# Patient Record
Sex: Female | Born: 1945 | Race: Black or African American | Hispanic: No | Marital: Single | Smoking: Never smoker
Health system: Southern US, Community
[De-identification: ages and names within clinical notes are randomized; demographics above are authoritative.]

## PROBLEM LIST (undated history)

## (undated) DIAGNOSIS — E119 Type 2 diabetes mellitus without complications: Secondary | ICD-10-CM

## (undated) DIAGNOSIS — I10 Essential (primary) hypertension: Secondary | ICD-10-CM

## (undated) HISTORY — PX: CARPAL TUNNEL RELEASE: SHX101

## (undated) HISTORY — PX: ABDOMINAL HYSTERECTOMY: SHX81

---

## 2015-05-29 ENCOUNTER — Emergency Department (HOSPITAL_COMMUNITY): Payer: Medicare Other

## 2015-05-29 ENCOUNTER — Emergency Department (HOSPITAL_COMMUNITY)
Admission: EM | Admit: 2015-05-29 | Discharge: 2015-05-29 | Disposition: A | Discharge status: Home / Self Care | Payer: Medicare Other | Attending: Emergency Medicine | Admitting: Emergency Medicine

## 2015-05-29 ENCOUNTER — Encounter (HOSPITAL_COMMUNITY): Payer: Self-pay | Admitting: Oncology

## 2015-05-29 DIAGNOSIS — W1839XA Other fall on same level, initial encounter: Secondary | ICD-10-CM | POA: Insufficient documentation

## 2015-05-29 DIAGNOSIS — R739 Hyperglycemia, unspecified: Secondary | ICD-10-CM

## 2015-05-29 DIAGNOSIS — Y92009 Unspecified place in unspecified non-institutional (private) residence as the place of occurrence of the external cause: Secondary | ICD-10-CM

## 2015-05-29 DIAGNOSIS — I10 Essential (primary) hypertension: Secondary | ICD-10-CM | POA: Diagnosis not present

## 2015-05-29 DIAGNOSIS — Y92002 Bathroom of unspecified non-institutional (private) residence single-family (private) house as the place of occurrence of the external cause: Secondary | ICD-10-CM | POA: Insufficient documentation

## 2015-05-29 DIAGNOSIS — Y998 Other external cause status: Secondary | ICD-10-CM | POA: Diagnosis not present

## 2015-05-29 DIAGNOSIS — E1165 Type 2 diabetes mellitus with hyperglycemia: Secondary | ICD-10-CM | POA: Insufficient documentation

## 2015-05-29 DIAGNOSIS — G8929 Other chronic pain: Secondary | ICD-10-CM | POA: Insufficient documentation

## 2015-05-29 DIAGNOSIS — W19XXXA Unspecified fall, initial encounter: Secondary | ICD-10-CM

## 2015-05-29 DIAGNOSIS — Z043 Encounter for examination and observation following other accident: Secondary | ICD-10-CM | POA: Diagnosis present

## 2015-05-29 DIAGNOSIS — Y9389 Activity, other specified: Secondary | ICD-10-CM | POA: Insufficient documentation

## 2015-05-29 HISTORY — DX: Essential (primary) hypertension: I10

## 2015-05-29 HISTORY — DX: Type 2 diabetes mellitus without complications: E11.9

## 2015-05-29 LAB — COMPREHENSIVE METABOLIC PANEL
ALBUMIN: 4.3 g/dL (ref 3.5–5.0)
ALK PHOS: 76 U/L (ref 38–126)
ALT: 15 U/L (ref 14–54)
AST: 12 U/L — AB (ref 15–41)
Anion gap: 10 (ref 5–15)
BILIRUBIN TOTAL: 0.3 mg/dL (ref 0.3–1.2)
BUN: 19 mg/dL (ref 6–20)
CHLORIDE: 100 mmol/L — AB (ref 101–111)
CO2: 27 mmol/L (ref 22–32)
Calcium: 9.5 mg/dL (ref 8.9–10.3)
Creatinine, Ser: 0.89 mg/dL (ref 0.44–1.00)
GFR calc Af Amer: >60 mL/min (ref >60)
Glucose, Bld: 427 mg/dL — ABNORMAL HIGH (ref 65–99)
Potassium: 4 mmol/L (ref 3.5–5.1)
Sodium: 137 mmol/L (ref 135–145)
Total Protein: 7.5 g/dL (ref 6.5–8.1)

## 2015-05-29 LAB — URINALYSIS, ROUTINE W REFLEX MICROSCOPIC
BILIRUBIN URINE: NEGATIVE
Glucose, UA: >1000 mg/dL — AB
HGB URINE DIPSTICK: NEGATIVE
Ketones, ur: NEGATIVE mg/dL
Leukocytes, UA: NEGATIVE
NITRITE: NEGATIVE
PROTEIN: NEGATIVE mg/dL
SPECIFIC GRAVITY, URINE: 1.043 — AB (ref 1.005–1.030)
pH: 5 (ref 5.0–8.0)

## 2015-05-29 LAB — CBC WITH DIFFERENTIAL/PLATELET
Basophils Absolute: 0 10*3/uL (ref 0.0–0.1)
Basophils Relative: 0 %
Eosinophils Absolute: 0.2 10*3/uL (ref 0.0–0.7)
Eosinophils Relative: 2 %
HEMATOCRIT: 45 % (ref 36.0–46.0)
HEMOGLOBIN: 14.6 g/dL (ref 12.0–15.0)
LYMPHS ABS: 2.7 10*3/uL (ref 0.7–4.0)
LYMPHS PCT: 36 %
MCH: 26.4 pg (ref 26.0–34.0)
MCHC: 32.4 g/dL (ref 30.0–36.0)
MCV: 81.5 fL (ref 78.0–100.0)
MONOS PCT: 6 %
Monocytes Absolute: 0.4 10*3/uL (ref 0.1–1.0)
NEUTROS ABS: 4.2 10*3/uL (ref 1.7–7.7)
NEUTROS PCT: 56 %
PLATELETS: 171 10*3/uL (ref 150–400)
RBC: 5.52 MIL/uL — ABNORMAL HIGH (ref 3.87–5.11)
RDW: 13.8 % (ref 11.5–15.5)
WBC: 7.5 10*3/uL (ref 4.0–10.5)

## 2015-05-29 LAB — CBG MONITORING, ED
GLUCOSE-CAPILLARY: 370 mg/dL — AB (ref 65–99)
Glucose-Capillary: 534 mg/dL — ABNORMAL HIGH (ref 65–99)

## 2015-05-29 LAB — URINE MICROSCOPIC-ADD ON
RBC / HPF: NONE SEEN RBC/hpf (ref 0–5)
WBC UA: NONE SEEN WBC/hpf (ref 0–5)

## 2015-05-29 MED ORDER — METFORMIN HCL 500 MG PO TABS: 500.0000 mg | ORAL_TABLET | Freq: Two times a day (BID) | ORAL | Status: AC

## 2015-05-29 MED ORDER — SODIUM CHLORIDE 0.9 % IV SOLN
1000.0000 mL | Freq: Once | INTRAVENOUS | Status: AC
  Administered 2015-05-29: 1000 mL via INTRAVENOUS

## 2015-05-29 MED ORDER — INSULIN ASPART 100 UNIT/ML ~~LOC~~ SOLN
8.0000 [IU] | Freq: Once | SUBCUTANEOUS | Status: AC
  Administered 2015-05-29: 8 [IU] via INTRAVENOUS
  Filled 2015-05-29: qty 1

## 2015-05-29 MED ORDER — SODIUM CHLORIDE 0.9 % IV SOLN
1000.0000 mL | INTRAVENOUS | Status: DC
  Administered 2015-05-29: 1000 mL via INTRAVENOUS

## 2015-05-29 NOTE — Discharge Instructions (Signed)
Fall Prevention in the Home  Falls can cause injuries and can affect people from all age groups. There are many simple things that you can do to make your home safe and to help prevent falls. WHAT CAN I DO ON THE OUTSIDE OF MY HOME?  Regularly repair the edges of walkways and driveways and fix any cracks.  Remove high doorway thresholds.  Trim any shrubbery on the main path into your home.  Use bright outdoor lighting.  Clear walkways of debris and clutter, including tools and rocks.  Regularly check that handrails are securely fastened and in good repair. Both sides of any steps should have handrails.  Install guardrails along the edges of any raised decks or porches.  Have leaves, snow, and ice cleared regularly.  Use sand or salt on walkways during winter months.  In the garage, clean up any spills right away, including grease or oil spills. WHAT CAN I DO IN THE BATHROOM?  Use night lights.  Install grab bars by the toilet and in the tub and shower. Do not use towel bars as grab bars.  Use non-skid mats or decals on the floor of the tub or shower.  If you need to sit down while you are in the shower, use a plastic, non-slip stool.[place]  Keep the floor dry. Immediately clean up any water that spills on the floor.  Remove soap buildup in the tub or shower on a regular basis.  Attach bath mats securely with double-sided non-slip rug tape.  Remove throw rugs and other tripping hazards from the floor. WHAT CAN I DO IN THE BEDROOM?  Use night lights.  Make sure that a bedside light is easy to reach.  Do not use oversized bedding that drapes onto the floor.  Have a firm chair that has side arms to use for getting dressed.  Remove throw rugs and other tripping hazards from the floor. WHAT CAN I DO IN THE KITCHEN?   Clean up any spills right away.  Avoid walking on wet floors.  Place frequently used items in easy-to-reach places.  If you need to reach for something  above you, use a sturdy step stool that has a grab bar.  Keep electrical cables out of the way.  Do not use floor polish or wax that makes floors slippery. If you have to use wax, make sure that it is non-skid floor wax.  Remove throw rugs and other tripping hazards from the floor. WHAT CAN I DO IN THE STAIRWAYS?  Do not leave any items on the stairs.  Make sure that there are handrails on both sides of the stairs. Fix handrails that are broken or loose. Make sure that handrails are as long as the stairways.  Check any carpeting to make sure that it is firmly attached to the stairs. Fix any carpet that is loose or worn.  Avoid having throw rugs at the top or bottom of stairways, or secure the rugs with carpet tape to prevent them from moving.  Make sure that you have a light switch at the top of the stairs and the bottom of the stairs. If you do not have them, have them installed. WHAT ARE SOME OTHER FALL PREVENTION TIPS?  Wear closed-toe shoes that fit well and support your feet. Wear shoes that have rubber soles or low heels.  When you use a stepladder, make sure that it is completely opened and that the sides are firmly locked. Have someone hold the ladder while you  are using it. Do not climb a closed stepladder.  Add color or contrast paint or tape to grab bars and handrails in your home. Place contrasting color strips on the first and last steps.  Use mobility aids as needed, such as canes, walkers, scooters, and crutches.  Turn on lights if it is dark. Replace any light bulbs that burn out.  Set up furniture so that there are clear paths. Keep the furniture in the same spot.  Fix any uneven floor surfaces.  Choose a carpet design that does not hide the edge of steps of a stairway.  Be aware of any and all pets.  Review your medicines with your healthcare provider. Some medicines can cause dizziness or changes in blood pressure, which increase your risk of falling. Talk  with your health care provider about other ways that you can decrease your risk of falls. This may include working with a physical therapist or trainer to improve your strength, balance, and endurance.   This information is not intended to replace advice given to you by your health care provider. Make sure you discuss any questions you have with your health care provider.   Document Released: 03/29/2002 Document Revised: 08/23/2014 Document Reviewed: 05/13/2014 Elsevier Interactive Patient Education 2016 [employer].  Hyperglycemia Hyperglycemia occurs when the glucose (sugar) in your blood is too high. Hyperglycemia can happen for many reasons, but it most often happens to people who do not know they have diabetes or are not managing their diabetes properly.  CAUSES  Whether you have diabetes or not, there are other causes of hyperglycemia. Hyperglycemia can occur when you have diabetes, but it can also occur in other situations that you might not be as aware of, such as: Diabetes  If you have diabetes and are having problems controlling your blood glucose, hyperglycemia could occur because of some of the following reasons:  Not following your meal plan.  Not taking your diabetes medications or not taking it properly.  Exercising less or doing less activity than you normally do.  Being sick. Pre-diabetes  This cannot be ignored. Before people develop Type 2 diabetes, they almost always have "pre-diabetes." This is when your blood glucose levels are higher than normal, but not yet high enough to be diagnosed as diabetes. Research has shown that some long-term damage to the body, especially the heart and circulatory system, may already be occurring during pre-diabetes. If you take action to manage your blood glucose when you have pre-diabetes, you may delay or prevent Type 2 diabetes from developing. Stress  If you have diabetes, you may be "diet" controlled or on oral medications or  insulin to control your diabetes. However, you may find that your blood glucose is higher than usual in the hospital whether you have diabetes or not. This is often referred to as "stress hyperglycemia." Stress can elevate your blood glucose. This happens because of hormones put out by the body during times of stress. If stress has been the cause of your high blood glucose, it can be followed regularly by your caregiver. That way he/she can make sure your hyperglycemia does not continue to get worse or progress to diabetes. Steroids  Steroids are medications that act on the infection fighting system (immune system) to block inflammation or infection. One side effect can be a rise in blood glucose. Most people can produce enough extra insulin to allow for this rise, but for those who cannot, steroids make blood glucose levels go even higher. It  is not unusual for steroid treatments to "uncover" diabetes that is developing. It is not always possible to determine if the hyperglycemia will go away after the steroids are stopped. A special blood test called an A1c is sometimes done to determine if your blood glucose was elevated before the steroids were started. SYMPTOMS  Thirsty.  Frequent urination.  Dry mouth.  Blurred vision.  Tired or fatigue.  Weakness.  Sleepy.  Tingling in feet or leg. DIAGNOSIS  Diagnosis is made by monitoring blood glucose in one or all of the following ways:  A1c test. This is a chemical found in your blood.  Fingerstick blood glucose monitoring.  Laboratory results. TREATMENT  First, knowing the cause of the hyperglycemia is important before the hyperglycemia can be treated. Treatment may include, but is not be limited to:  Education.  Change or adjustment in medications.  Change or adjustment in meal plan.  Treatment for an illness, infection, etc.  More frequent blood glucose monitoring.  Change in exercise plan.  Decreasing or stopping  steroids.  Lifestyle changes. HOME CARE INSTRUCTIONS   Test your blood glucose as directed.  Exercise regularly. Your caregiver will give you instructions about exercise. Pre-diabetes or diabetes which comes on with stress is helped by exercising.  Eat wholesome, balanced meals. Eat often and at regular, fixed times. Your caregiver or nutritionist will give you a meal plan to guide your sugar intake.  Being at an ideal weight is important. If needed, losing as little as 10 to 15 pounds may help improve blood glucose levels. SEEK MEDICAL CARE IF:   You have questions about medicine, activity, or diet.  You continue to have symptoms (problems such as increased thirst, urination, or weight gain). SEEK IMMEDIATE MEDICAL CARE IF:   You are vomiting or have diarrhea.  Your breath smells fruity.  You are breathing faster or slower.  You are very sleepy or incoherent.  You have numbness, tingling, or pain in your feet or hands.  You have chest pain.  Your symptoms get worse even though you have been following your caregiver's orders.  If you have any other questions or concerns.   This information is not intended to replace advice given to you by your health care provider. Make sure you discuss any questions you have with your health care provider.   Document Released: 10/02/2000 Document Revised: 07/01/2011 Document Reviewed: 12/13/2014 Elsevier Interactive Patient Education 2016 [employer].  Metformin tablets What is this medicine? METFORMIN (met FOR min) is used to treat type 2 diabetes. It helps to control blood sugar. Treatment is combined with diet and exercise. This medicine can be used alone or with other medicines for diabetes. This medicine may be used for other purposes; ask your health care provider or pharmacist if you have questions. What should I tell my health care provider before I take this medicine? They need to know if you have any of these  conditions: -anemia -frequently drink alcohol-containing beverages -become easily dehydrated -heart attack -heart failure that is treated with medications -kidney disease -liver disease -polycystic ovary syndrome -serious infection or injury -vomiting -an unusual or allergic reaction to metformin, other medicines, foods, dyes, or preservatives -pregnant or trying to get pregnant -breast-feeding How should I use this medicine? Take this medicine by mouth. Take it with meals. Swallow the tablets with a drink of water. Follow the directions on the prescription label. Take your medicine at regular intervals. Do not take your medicine more often than directed. Talk  to your pediatrician regarding the use of this medicine in children. While this drug may be prescribed for children as young as 87 years of age for selected conditions, precautions do apply. Overdosage: If you think you have taken too much of this medicine contact a poison control center or emergency room at once. NOTE: This medicine is only for you. Do not share this medicine with others. What if I miss a dose? If you miss a dose, take it as soon as you can. If it is almost time for your next dose, take only that dose. Do not take double or extra doses. What may interact with this medicine? Do not take this medicine with any of the following medications: -dofetilide -gatifloxacin -certain contrast medicines given before X-rays, CT scans, MRI, or other procedures This medicine may also interact with the following medications: -acetazolamide -certain medicines for HIV infection or hepatitis, like adefovir, emtricitabine, entecavir, lamivudine, or tenofovir -cimetidine -crizotinib -digoxin -diuretics -female hormones, like estrogens or progestins and birth control pills -glycopyrrolate -isoniazid -lamotrigine -medicines for blood pressure, heart disease, irregular heart  beat -memantine -midodrine -methazolamide -morphine -nicotinic acid -phenothiazines like chlorpromazine, mesoridazine, prochlorperazine, thioridazine -phenytoin -procainamide -propantheline -quinidine -quinine -ranitidine -ranolazine -steroid medicines like prednisone or cortisone -stimulant medicines for attention disorders, weight loss, or to stay awake -thyroid medicines -topiramate -trimethoprim -trospium -vancomycin -vandetanib -zonisamide This list may not describe all possible interactions. Give your health care provider a list of all the medicines, herbs, non-prescription drugs, or dietary supplements you use. Also tell them if you smoke, drink alcohol, or use illegal drugs. Some items may interact with your medicine. What should I watch for while using this medicine? Visit your doctor or health care professional for regular checks on your progress. A test called the HbA1C (A1C) will be monitored. This is a simple blood test. It measures your blood sugar control over the last 2 to 3 months. You will receive this test every 3 to 6 months. Learn how to check your blood sugar. Learn the symptoms of low and high blood sugar and how to manage them. Always carry a quick-source of sugar with you in case you have symptoms of low blood sugar. Examples include hard sugar candy or glucose tablets. Make sure others know that you can choke if you eat or drink when you develop serious symptoms of low blood sugar, such as seizures or unconsciousness. They must get medical help at once. Tell your doctor or health care professional if you have high blood sugar. You might need to change the dose of your medicine. If you are sick or exercising more than usual, you might need to change the dose of your medicine. Do not skip meals. Ask your doctor or health care professional if you should avoid alcohol. Many nonprescription cough and cold products contain sugar or alcohol. These can affect blood  sugar. This medicine may cause ovulation in premenopausal women who do not have regular monthly periods. This may increase your chances of becoming pregnant. You should not take this medicine if you become pregnant or think you may be pregnant. Talk with your doctor or health care professional about your birth control options while taking this medicine. Contact your doctor or health care professional right away if think you are pregnant. If you are going to need surgery, a MRI, CT scan, or other procedure, tell your doctor that you are taking this medicine. You may need to stop taking this medicine before the procedure. Wear a medical ID  bracelet or chain, and carry a card that describes your disease and details of your medicine and dosage times. What side effects may I notice from receiving this medicine? Side effects that you should report to your doctor or health care professional as soon as possible: -allergic reactions like skin rash, itching or hives, swelling of the face, lips, or tongue -breathing problems -feeling faint or lightheaded, falls -muscle aches or pains -signs and symptoms of low blood sugar such as feeling anxious, confusion, dizziness, increased hunger, unusually weak or tired, sweating, shakiness, cold, irritable, headache, blurred vision, fast heartbeat, loss of consciousness -slow or irregular heartbeat -unusual stomach pain or discomfort -unusually tired or weak Side effects that usually do not require medical attention (report to your doctor or health care professional if they continue or are bothersome): -diarrhea -headache -heartburn -metallic taste in mouth -nausea -stomach gas, upset This list may not describe all possible side effects. Call your doctor for medical advice about side effects. You may report side effects to FDA at 1-800-FDA-1088. Where should I keep my medicine? Keep out of the reach of children. Store at room temperature between 15 and 30 degrees  C (59 and 86 degrees F). Protect from moisture and light. Throw away any unused medicine after the expiration date. NOTE: This sheet is a summary. It may not cover all possible information. If you have questions about this medicine, talk to your doctor, pharmacist, or health care provider.    2016, Elsevier/Gold Standard. (2013-09-21 22:14:40)   Emergency Department Resource Guide 1) Find a Doctor and Pay Out of Pocket Although you won't have to find out who is covered by your insurance plan, it is a good idea to ask around and get recommendations. You will then need to call the office and see if the doctor you have chosen will accept you as a new patient and what types of options they offer for patients who are self-pay. Some doctors offer discounts or will set up payment plans for their patients who do not have insurance, but you will need to ask so you aren't surprised when you get to your appointment.  2) Contact Your Local Health Department Not all health departments have doctors that can see patients for sick visits, but many do, so it is worth a call to see if yours does. If you don't know where your local health department is, you can check in your phone book. The CDC also has a tool to help you locate your state's health department, and many state websites also have listings of all of their local health departments.  3) Find a [facility] If your illness is not likely to be very severe or complicated, you may want to try a walk in clinic. These are popping up all over the country in pharmacies, drugstores, and shopping centers. They're usually staffed by nurse practitioners or physician assistants that have been trained to treat common illnesses and complaints. They're usually fairly quick and inexpensive. However, if you have serious medical issues or chronic medical problems, these are probably not your best option.  No Primary Care Doctor: - Call Health Connect at  [phone] - they  can help you locate a primary care doctor that  accepts your insurance, provides certain services, etc. - Physician Referral Service- [phone]  Chronic Pain Problems: Organization         Address  Phone   Notes  [facility]  [phone] Patients need to be referred by  their primary care doctor.   Medication Assistance: Organization         Address  Phone   Notes  [facility] Medication [facility] [address] [place], [place] [address] [phone] --Must be a resident of [facility] -- Must have NO insurance coverage whatsoever (no Medicaid/ Medicare, etc.) -- The pt. MUST have a primary care doctor that directs their care regularly and follows them in the community   [facility]  [phone]   [employer]  [phone]    Agencies that provide inexpensive medical care: Organization         Address  Phone   Notes  [place]  [phone]   [facility]    [phone]   [facility] [place], [place] [address] [phone]   [place] [address] [place] [phone]   [facility]anned Parenthood    [phone]   [facility]    [phone]   [place] and [address], [place] Phone:  [phone], Fax:  [phone] Hours of Operation:  9 am - 6 pm, M-F.  Also accepts Medicaid/Medicare and self-pay.  [facility] for [address], [address], [place] Phone: [phone], Fax: [phone]. Hours of Operation:  8:30 am - 5:30 pm, M-F.  Also accepts Medicaid and self-pay.  [facility] [address], [place] Phone: [phone]   [place], [place], [place] [phone], Ext. [phone] Mondays & Thursdays: 7-9 AM.  First 15 patients are seen on a first come, first serve basis.    [place] Providers:  Organization         Address  Phone   Notes  [facility] [address], [place] [phone] Also accepts self-pay patients.  [facility] [address], [place]  [phone]   [place], [address], [place] [phone]   [facility] [address] [place] [phone]   Lucianne Lei [address], [place]   [phone] Only accepts [place] patients after they have their name applied to their card.   Self-Pay (no insurance) in [facility]:  Organization         Address  Phone   Notes  Sickle Cell Patients, [facility] [place] [phone]   [facility] [place] [phone]   [facility] [place]  [place], [place], [place] [phone]   [facility]/Dr. Osei-Bonsu  [address], [place] or [address], [place] [phone] Phone number for both [place] and [place] locations is the same.  [facility] and [facility] [address], [place] [phone]   [facility] [address], [place] or [address] [phone]   [facility] [address] [place] [phone], phone; [phone], fax Sees patients 1st and 3rd Saturday of every month.  Must not qualify for public or private insurance (i.e. Medicaid, Medicare, [place] Health Choice, Veterans' Benefits)  Household income should be no more than 200%  of the poverty level The clinic cannot treat you if you are pregnant or think you are pregnant  Sexually transmitted diseases are not treated at the clinic.    Dental Care: Organization         Address  Phone  Notes  [facility] Department of [facility] [phone] Accepts children up to age 39 who are enrolled in [place] or ; pregnant women with a Medicaid card; and children who have applied for Medicaid or [place] Health Choice, but were declined, whose parents can pay a reduced fee at time of service.  [facility] Department of [facility]  [address], [place] [phone] Accepts children up to age 60 who are enrolled in [place] or [place]; pregnant women with a Medicaid card; and children who have applied for Medicaid or [place] Health Choice, but were declined, whose parents can pay a reduced fee at time of service.  [facility]  [place] [phone] Patients are seen by appointment only. Walk-ins are not accepted. [place] will see patients 1 years of age and older. Monday - Tuesday (8am-5pm) Most Wednesdays (8:30-5pm) $30 per visit, cash only  [facility] Adult [facility] Access PRO[facility]  [address], [facility] [phone] Patients are seen by appointment only. Walk-ins are not accepted. [place] will see patients 79 years of age and older. One Wednesday Evening (Monthly: Volunteer Based).  $30 per visit, cash only  [place]  [phone] for adults; Children under age 36, call Graduate Pediatric Dentistry at [phone]. Children aged 84-14, please call [phone] to request a pediatric application.  Dental services are provided in all areas of dental care including fillings, crowns and bridges, complete and partial dentures, implants, gum treatment, root canals, and extractions. Preventive care is also provided. Treatment is provided to both adults and children. Patients are selected via a lottery and there is often a waiting list.   [facility] [address], [place]  [phone] [email or web address]   [facility]  [facility] [address] [place], [place] [phone], Ext. [phone] Second and Fourth Thursday of each month, opens at 6:30 AM; Clinic ends at 9 AM.  Patients are seen on a first-come first-served basis, and a limited number are seen during each clinic.   [facility]  [address] [place], [place] [phone]   Eligibility Requirements You must have lived in [place], [place], or [place] counties for at least the last three months.   You cannot be eligible for state or federal sponsored [employer], including [employer], [place], or [employer].   You generally cannot be eligible for healthcare insurance through your employer.    How to apply: Eligibility screenings are held every Tuesday and Wednesday afternoon from 1:00 pm until 4:00 pm. You do not need an appointment for the interview!  [facility] [address] [place], [place]   [place]  [place] Department  [place]  [phone]    Behavioral Health Resources in the Community: Intensive Outpatient Programs Organization         Address  Phone  Notes  [place]. [address], [place], [place]   [facility]  [address] [place], [place]   [facility] [address] [place], [place]   [place] [address],  [place], [place] or [phone]   Substance Abuse Resources Organization         Address  Phone  Notes  [facility]  [phone]   [place]  [phone]   [facility]   Chinita Pester  [phone]   Residential & Outpatient Substance Abuse Program  [phone]   Psychological Services Organization         Address  Phone  Notes  [facility] [place]   [place]  [phone]   [place] [address] [place] or [phone]    Mobile Crisis Teams Organization         Address  Phone  Notes  [facility], Mobile Crisis Care Unit  [phone]   [facility]  [address] [place], [place]   [place] [address], [place] [phone]    Self-Help/Support Groups Organization         Address  Phone             Notes  [place] - variety of support groups  [place] Call for more information  Narcotics Anonymous (NA), [facility] [address], [employer] [place]  2 meetings at this location   Special educational needs teacher         Address  Phone  Notes  [facility] [place],    [place]  [phone]   [facility]  [address] [place] [address], [place], [place]   [place], [place] [phone] Admissions: 8am-3pm M-F  Incentives Substance [place] [address],    [place], [place] [phone]   [facility] [address] [place], [place], [place]   [facility] [address],  [place], [place]   [facility] - Intensive Outpatient [address], [place], [place]   [facility]  [place], [place] [phone] or [phone]   [facility] ([facility]) [address] [place], [place] Accepts Medicaid  [facility] [place] [address],  [place] [phone] Substance Abuse/Addiction Treatment   [facility] Organization         Address  Phone  Notes  [facility]  [phone]   Domenic Schwab, PhD [address] [place]lis [place], [place]   [phone] or  [phone]   [place], [place] [phone]   [facility] [address] [place], [place] [phone] Insurance/Medicaid/sponsorship through [facility] [address] [place]                                    [place], [place] [phone] [place] [address], [place] [phone]    Dr. Adele Schilder  [phone]   [facility] Dept. 1) [address], [place] 2) [place] 3)  [address]  [place], [place] [phone]  [phone]   [facility] Child Abuse Hotline [phone] or [phone] (After Hours)

## 2015-05-29 NOTE — ED Notes (Signed)
Patient transported to CT 

## 2015-05-29 NOTE — ED Notes (Signed)
Per EMS pt had a fall tonight  No injury to note  Pt has chronic leg pain  CBG was 465  Pt is a diabetic and takes insulin three times a day but has only taken it once today

## 2015-05-29 NOTE — ED Provider Notes (Signed)
CSN: [id number]     Arrival date & time 05/29/15  0129 History  By signing my name below, I, Phillis Haggis, attest that this documentation has been prepared under the direction and in the presence of Dione Booze, MD. Electronically Signed: Phillis Haggis, ED Scribe. 05/29/2015. 2:55 AM.   Chief Complaint  Patient presents with  . Fall   The history is provided by the patient. No language interpreter was used.   HPI Comments: Rhonda Taylor is a 69 y.o. Female with a hx of chronic leg pain, HTN and DM who presents to the Emergency Department complaining of a fall onset PTA. Pt states that her CBG was elevated at home, and she fell in the bathroom. She states that the last time she checked her sugars was Tuesday, and it "was more than likely" elevated. Pt denies nausea, vomiting, hitting head, or syncope. Per triage note, pt takes insulin 3x a day, but only took one dose today.  Past Medical History  Diagnosis Date  . Hypertension   . Diabetes mellitus without complication [facility])    Past Surgical History  Procedure Laterality Date  . Abdominal hysterectomy    . Carpal tunnel release Right    No family history on file. Social History  Substance Use Topics  . Smoking status: Never Smoker   . Smokeless tobacco: Never Used  . Alcohol Use: No   OB History    No data available     Review of Systems  Gastrointestinal: Negative for nausea and vomiting.  All other systems reviewed and are negative.  Allergies  Review of patient's allergies indicates no known allergies.  Home Medications   Prior to Admission medications   Not on File   BP 150/62 mmHg  Pulse 80  Temp(Src) 98.5 F (36.9 C) (Oral)  Resp 20  Ht  (1.6 m)  Wt 240 lb (108.863 kg)  BMI 42.52 kg/m2  SpO2 96% Physical Exam  Constitutional: She is oriented to person, place, and time. She appears well-developed and well-nourished.  HENT:  Head: Normocephalic and atraumatic.  Eyes: EOM are normal. Pupils are  equal, round, and reactive to light.  Neck: Normal range of motion. Neck supple. No JVD present.  Cardiovascular: Normal rate, regular rhythm and normal heart sounds.  Exam reveals no gallop and no friction rub.   No murmur heard. Pulmonary/Chest: Effort normal and breath sounds normal. She has no wheezes. She has no rales. She exhibits no tenderness.  Abdominal: Soft. Bowel sounds are normal. She exhibits no distension and no mass. There is no tenderness.  Musculoskeletal: Normal range of motion. She exhibits edema.  1+ pitting edema bilaterally  Lymphadenopathy:    She has no cervical adenopathy.  Neurological: She is alert and oriented to person, place, and time. No cranial nerve deficit. She exhibits normal muscle tone. Coordination normal.  Skin: Skin is warm and dry. No rash noted.  Psychiatric: She has a normal mood and affect. Her behavior is normal. Judgment and thought content normal.  Nursing note and vitals reviewed.   ED Course  Procedures (including critical care time) DIAGNOSTIC STUDIES: Oxygen Saturation is 96% on RA, normal by my interpretation.    COORDINATION OF CARE: 1:53 AM-Discussed treatment plan which includes labs with pt at bedside and pt agreed to plan.    Labs Review Labs Reviewed  COMPREHENSIVE METABOLIC PANEL - Abnormal; Notable for the following:    Chloride 100 (*)    Glucose, Bld 427 (*)    AST  12 (*)    All other components within normal limits  CBC WITH DIFFERENTIAL/PLATELET - Abnormal; Notable for the following:    RBC 5.52 (*)    All other components within normal limits  URINALYSIS, ROUTINE W REFLEX MICROSCOPIC (NOT AT [facility]) - Abnormal; Notable for the following:    APPearance CLOUDY (*)    Specific Gravity, Urine 1.043 (*)    Glucose, UA >1000 (*)    All other components within normal limits  URINE MICROSCOPIC-ADD ON - Abnormal; Notable for the following:    Squamous Epithelial / LPF 0-5 (*)    Bacteria, UA RARE (*)    All other  components within normal limits  CBG MONITORING, ED - Abnormal; Notable for the following:    Glucose-Capillary 534 (*)    All other components within normal limits  CBG MONITORING, ED - Abnormal; Notable for the following:    Glucose-Capillary 370 (*)    All other components within normal limits    Imaging Review Ct Head Wo Contrast  05/29/2015  CLINICAL DATA:  Status post fall in bathroom, with concern for head or cervical spine injury. Hyperglycemia. Initial encounter. EXAM: CT HEAD WITHOUT CONTRAST CT CERVICAL SPINE WITHOUT CONTRAST TECHNIQUE: Multidetector CT imaging of the head and cervical spine was performed following the standard protocol without intravenous contrast. Multiplanar CT image reconstructions of the cervical spine were also generated. COMPARISON:  None. FINDINGS: CT HEAD FINDINGS There is no evidence of acute infarction, mass lesion, or intra- or extra-axial hemorrhage on CT. Prominence of the ventricles and sulci reflects mild cortical volume loss. Scattered periventricular and subcortical white matter change likely reflects small vessel ischemic microangiopathy. Mild cerebellar atrophy is noted. The brainstem and fourth ventricle are within normal limits. The basal ganglia are unremarkable in appearance. The cerebral hemispheres demonstrate grossly normal gray-white differentiation. No mass effect or midline shift is seen. There is no evidence of fracture; visualized osseous structures are unremarkable in appearance. The visualized portions of the orbits are within normal limits. The paranasal sinuses and mastoid air cells are well-aerated. No significant soft tissue abnormalities are seen. CT CERVICAL SPINE FINDINGS There is no evidence of fracture or subluxation. Vertebral bodies demonstrate normal height and alignment. Mild disc space narrowing and endplate degenerative change is noted at C6-C7. Prevertebral soft tissues are within normal limits. The visualized neural foramina are  grossly unremarkable. The thyroid gland is unremarkable in appearance. The visualized lung apices are clear. No significant soft tissue abnormalities are seen. IMPRESSION: 1. No evidence of traumatic intracranial injury or fracture. 2. No evidence of fracture or subluxation along the cervical spine. 3. Mild cortical volume loss and scattered small vessel ischemic microangiopathy. Electronically Signed   By: Roanna Raider M.D.   On: 05/29/2015 02:52   Ct Cervical Spine Wo Contrast  05/29/2015  CLINICAL DATA:  Status post fall in bathroom, with concern for head or cervical spine injury. Hyperglycemia. Initial encounter. EXAM: CT HEAD WITHOUT CONTRAST CT CERVICAL SPINE WITHOUT CONTRAST TECHNIQUE: Multidetector CT imaging of the head and cervical spine was performed following the standard protocol without intravenous contrast. Multiplanar CT image reconstructions of the cervical spine were also generated. COMPARISON:  None. FINDINGS: CT HEAD FINDINGS There is no evidence of acute infarction, mass lesion, or intra- or extra-axial hemorrhage on CT. Prominence of the ventricles and sulci reflects mild cortical volume loss. Scattered periventricular and subcortical white matter change likely reflects small vessel ischemic microangiopathy. Mild cerebellar atrophy is noted. The brainstem and fourth ventricle are  within normal limits. The basal ganglia are unremarkable in appearance. The cerebral hemispheres demonstrate grossly normal gray-white differentiation. No mass effect or midline shift is seen. There is no evidence of fracture; visualized osseous structures are unremarkable in appearance. The visualized portions of the orbits are within normal limits. The paranasal sinuses and mastoid air cells are well-aerated. No significant soft tissue abnormalities are seen. CT CERVICAL SPINE FINDINGS There is no evidence of fracture or subluxation. Vertebral bodies demonstrate normal height and alignment. Mild disc space  narrowing and endplate degenerative change is noted at C6-C7. Prevertebral soft tissues are within normal limits. The visualized neural foramina are grossly unremarkable. The thyroid gland is unremarkable in appearance. The visualized lung apices are clear. No significant soft tissue abnormalities are seen. IMPRESSION: 1. No evidence of traumatic intracranial injury or fracture. 2. No evidence of fracture or subluxation along the cervical spine. 3. Mild cortical volume loss and scattered small vessel ischemic microangiopathy. Electronically Signed   By: Roanna Raider M.D.   On: 05/29/2015 02:52   I have personally reviewed and evaluated these images and lab results as part of my medical decision-making.   MDM   Final diagnoses:  None    Fall without evidence of significant injury. She is sent for CT of head and cervical spine which showed no acute injury. Screening labs are obtained showing significant hyperglycemia without evidence of ketoacidosis. After IV fluids, she was still somewhat hyperglycemic and was given dose of Novolog Insulin. I have added metformin to her regimen to try to keep her sugar under better control and she's given th ED resource guide to try to establish primary care.  I personally performed the services described in this documentation, which was scribed in my presence. The recorded information has been reviewed and is accurate.      Dione Booze, MD 05/29/15 [phone]

## 2015-05-29 NOTE — ED Notes (Signed)
Pt reports no pain 

## 2016-01-04 DIAGNOSIS — I10 Essential (primary) hypertension: Secondary | ICD-10-CM | POA: Diagnosis not present

## 2016-01-04 DIAGNOSIS — E1165 Type 2 diabetes mellitus with hyperglycemia: Secondary | ICD-10-CM | POA: Diagnosis not present

## 2016-01-04 DIAGNOSIS — Z794 Long term (current) use of insulin: Secondary | ICD-10-CM | POA: Diagnosis not present

## 2016-01-22 ENCOUNTER — Other Ambulatory Visit (HOSPITAL_BASED_OUTPATIENT_CLINIC_OR_DEPARTMENT_OTHER): Payer: Self-pay

## 2016-01-22 DIAGNOSIS — R5383 Other fatigue: Secondary | ICD-10-CM

## 2016-01-26 ENCOUNTER — Ambulatory Visit (HOSPITAL_BASED_OUTPATIENT_CLINIC_OR_DEPARTMENT_OTHER): Payer: Medicare Other

## 2016-01-31 DIAGNOSIS — E785 Hyperlipidemia, unspecified: Secondary | ICD-10-CM | POA: Diagnosis not present

## 2016-01-31 DIAGNOSIS — I10 Essential (primary) hypertension: Secondary | ICD-10-CM | POA: Diagnosis not present

## 2016-01-31 DIAGNOSIS — Z794 Long term (current) use of insulin: Secondary | ICD-10-CM | POA: Diagnosis not present

## 2016-01-31 DIAGNOSIS — E1165 Type 2 diabetes mellitus with hyperglycemia: Secondary | ICD-10-CM | POA: Diagnosis not present

## 2016-02-04 ENCOUNTER — Ambulatory Visit (INDEPENDENT_AMBULATORY_CARE_PROVIDER_SITE_OTHER): Payer: Medicare Other

## 2016-02-04 ENCOUNTER — Encounter (HOSPITAL_COMMUNITY): Payer: Self-pay | Admitting: *Deleted

## 2016-02-04 ENCOUNTER — Ambulatory Visit (HOSPITAL_COMMUNITY)
Admission: EM | Admit: 2016-02-04 | Discharge: 2016-02-04 | Disposition: A | Discharge status: Home / Self Care | Payer: Medicare Other | Attending: Internal Medicine | Admitting: Internal Medicine

## 2016-02-04 DIAGNOSIS — K649 Unspecified hemorrhoids: Secondary | ICD-10-CM

## 2016-02-04 DIAGNOSIS — K5900 Constipation, unspecified: Secondary | ICD-10-CM

## 2016-02-04 LAB — POCT URINALYSIS DIP (DEVICE)
Bilirubin Urine: NEGATIVE
Glucose, UA: NEGATIVE mg/dL
HGB URINE DIPSTICK: NEGATIVE
KETONES UR: NEGATIVE mg/dL
Leukocytes, UA: NEGATIVE
Nitrite: NEGATIVE
PH: 5.5 (ref 5.0–8.0)
PROTEIN: NEGATIVE mg/dL
SPECIFIC GRAVITY, URINE: 1.025 (ref 1.005–1.030)
Urobilinogen, UA: 0.2 mg/dL (ref 0.0–1.0)

## 2016-02-04 MED ORDER — POLYETHYLENE GLYCOL 3350 17 G PO PACK: 17.0000 g | PACK | Freq: Every day | ORAL | 0 refills | Status: AC

## 2016-02-04 MED ORDER — ALIGN PO CAPS
1.0000 | ORAL_CAPSULE | Freq: Every day | ORAL | 0 refills | Status: AC
Start: 2016-02-04 — End: 2016-03-05

## 2016-02-04 NOTE — ED Provider Notes (Signed)
[facility]    CSN: [id number] Arrival date & time: 02/04/16  1709     History   Chief Complaint Chief Complaint  Patient presents with  . Abdominal Pain    HPI Rhonda Taylor is a 70 y.o. female. She presents today with onset of pain in her bottom about 5 days ago, she noticed it when she was getting out of a car. She wondered if it was a hemorrhoid. She has had some decreased frequency bowel movement, describes the stool as being like little hard pellets. She has chronic urinary incontinence no real change. Limits her fluid intake, to manage the urinary symptoms. Some discomfort more chronically in the epigastrium, and in the lower abdomen. Has difficulty at times with her food hanging up and not going all the way down after swallowing. She can usually get it to go down by drinking water. Having some nausea, no vomiting. Poor appetite today. No dysuria. No unusual vaginal discharge or bleeding.  HPI  Past Medical History:  Diagnosis Date  . Diabetes mellitus without complication (HCC)   . Hypertension      Past Surgical History:  Procedure Laterality Date  . ABDOMINAL HYSTERECTOMY    . CARPAL TUNNEL RELEASE Right       Home Medications    Prior to Admission medications   Medication Sig Start Date End Date Taking? Authorizing Provider  ALPRAZolam Prudy Feeler(XANAX) 0.5 MG tablet Take 0.5-1 mg by mouth daily as needed for anxiety.    Historical Provider, MD  amLODipine (NORVASC) 5 MG tablet Take 5 mg by mouth daily.    Historical Provider, MD  aspirin EC 81 MG tablet Take 81 mg by mouth daily.    Historical Provider, MD  bifidobacterium infantis (ALIGN) capsule Take 1 capsule by mouth daily. 02/04/16 03/05/16  Eustace MooreLaura W Ovadia Lopp, MD  ibuprofen (ADVIL,MOTRIN) 800 MG tablet Take 800 mg by mouth every 8 (eight) hours as needed for moderate pain.    Historical Provider, MD  insulin lispro (HUMALOG) 100 UNIT/ML injection Inject 25-30 Units into the skin 3 (three) times daily  before meals. 25 units at 3pm    Historical Provider, MD  metFORMIN (GLUCOPHAGE) 500 MG tablet Take 1 tablet (500 mg total) by mouth 2 (two) times daily with a meal. 05/29/15   Dione Boozeavid Glick, MD  polyethylene glycol [facility](MIRALAX / Ethelene HalGLYCOLAX) packet Take 17 g by mouth daily. 02/04/16   Eustace MooreLaura W Barbara Ahart, MD  traMADol (ULTRAM) 50 MG tablet Take 50 mg by mouth every 6 (six) hours as needed for moderate pain.    Historical Provider, MD    Family History History reviewed. No pertinent family history.  Social History Social History  Substance Use Topics  . Smoking status: Never Smoker  . Smokeless tobacco: Never Used  . Alcohol use No     Allergies   Review of patient's allergies indicates no known allergies.   Review of Systems Review of Systems  All other systems reviewed and are negative.    Physical Exam Triage Vital Signs ED Triage Vitals  Enc Vitals Group     BP 02/04/16 1810 175/74     Pulse Rate 02/04/16 1810 64     Resp 02/04/16 1810 20     Temp 02/04/16 1810 98.4 F (36.9 C)     Temp Source 02/04/16 1810 Oral     SpO2 02/04/16 1810 100 %     Weight --      Height --      Pain Score  02/04/16 1848 6   Updated Vital Signs BP 175/74 (BP Location: Left Arm)   Pulse 64   Temp 98.4 F (36.9 C) (Oral)   Resp 20   SpO2 100%  Physical Exam  Constitutional: She is oriented to person, place, and time. No distress.  Alert, nicely groomed  HENT:  Head: Atraumatic.  Eyes:  Conjugate gaze, no eye redness/drainage  Neck: Neck supple.  Cardiovascular: Normal rate and regular rhythm.   Pulmonary/Chest: No respiratory distress. She has no wheezes. She has no rales.  Lungs clear, symmetric breath sounds  Abdominal: Soft. She exhibits no distension. There is no rebound and no guarding.  Mild tenderness to deep palpation in the epigastrium Moderate tenderness to deep palpation in the left lower quadrant, reproducible  Genitourinary:     Genitourinary Comments: Slightly swollen  slightly excoriated area at the anus, slightly tender to palpation, consistent with resolving hemorrhoid  Musculoskeletal: Normal range of motion.  No leg swelling  Neurological: She is alert and oriented to person, place, and time.  Skin: Skin is warm and dry.  No cyanosis  Nursing note and vitals reviewed.    UC Treatments / Results  Labs  Results for orders placed or performed during the hospital encounter of 02/04/16  POCT urinalysis dip (device)  Result Value Ref Range   Glucose, UA NEGATIVE NEGATIVE mg/dL   Bilirubin Urine NEGATIVE NEGATIVE   Ketones, ur NEGATIVE NEGATIVE mg/dL   Specific Gravity, Urine 1.025 1.005 - 1.030   Hgb urine dipstick NEGATIVE NEGATIVE   pH 5.5 5.0 - 8.0   Protein, ur NEGATIVE NEGATIVE mg/dL   Urobilinogen, UA 0.2 0.0 - 1.0 mg/dL   Nitrite NEGATIVE NEGATIVE   Leukocytes, UA NEGATIVE NEGATIVE    Radiology  EXAM: DG ABDOMEN ACUTE W/ 1V CHEST  COMPARISON: None.  FINDINGS: There is no evidence of dilated bowel loops or free intraperitoneal air. No radiopaque calculi or other significant radiographic abnormality is seen. Heart size and mediastinal contours are within normal limits. Both lungs are clear. Expected amount of stool in the colon.  IMPRESSION: Negative abdominal radiographs. No acute cardiopulmonary disease.   Electronically Signed By: Marlan Palau M.D. On: 02/04/2016 19:40  Procedures Procedures (including critical care time)  Final Clinical Impressions(s) / UC Diagnoses   Final diagnoses:  Constipation, unspecified constipation type  Hemorrhoids, unspecified hemorrhoid type   Prescription for miralax sent to [facility] on [address].  Recheck for sustained/severe abdominal pain or if hemorrhoid does not seem to be continuing to improve.  New Prescriptions Discharge Medication List as of 02/04/2016  8:01 PM    START taking these medications   Details  bifidobacterium infantis (ALIGN) capsule Take 1 capsule  by mouth daily., Starting Sun 02/04/2016, Until Tue 03/05/2016, Normal    polyethylene glycol (MIRALAX / GLYCOLAX) packet Take 17 g by mouth daily., Starting Sun 02/04/2016, Normal         Eustace Moore, MD 02/07/16 [phone]

## 2016-02-04 NOTE — ED Triage Notes (Signed)
Pt has  Symptoms   Of    abd  Pain      With  Rectal   Pain  And  Constipation       Off  And  On            Pt had  A  bm today  But  It  Was  Hard     To  Touch          She  Is  Awake  And  Alert  And  Oriented

## 2016-02-04 NOTE — Discharge Instructions (Addendum)
[facility] [address]  Recheck for sustained/severe abdominal pain or if hemorrhoid does not seem to be continuing to improve.

## 2016-02-28 DIAGNOSIS — Z794 Long term (current) use of insulin: Secondary | ICD-10-CM | POA: Diagnosis not present

## 2016-02-28 DIAGNOSIS — I10 Essential (primary) hypertension: Secondary | ICD-10-CM | POA: Diagnosis not present

## 2016-02-28 DIAGNOSIS — E785 Hyperlipidemia, unspecified: Secondary | ICD-10-CM | POA: Diagnosis not present

## 2016-02-28 DIAGNOSIS — E1165 Type 2 diabetes mellitus with hyperglycemia: Secondary | ICD-10-CM | POA: Diagnosis not present

## 2016-02-28 DIAGNOSIS — Z23 Encounter for immunization: Secondary | ICD-10-CM | POA: Diagnosis not present

## 2016-03-08 ENCOUNTER — Encounter (HOSPITAL_BASED_OUTPATIENT_CLINIC_OR_DEPARTMENT_OTHER): Payer: Medicare Other

## 2016-03-08 DIAGNOSIS — K5900 Constipation, unspecified: Secondary | ICD-10-CM | POA: Diagnosis not present

## 2016-03-08 DIAGNOSIS — R1310 Dysphagia, unspecified: Secondary | ICD-10-CM | POA: Diagnosis not present

## 2016-03-08 DIAGNOSIS — R1013 Epigastric pain: Secondary | ICD-10-CM | POA: Diagnosis not present

## 2016-03-08 DIAGNOSIS — K219 Gastro-esophageal reflux disease without esophagitis: Secondary | ICD-10-CM | POA: Diagnosis not present

## 2016-03-22 ENCOUNTER — Ambulatory Visit (HOSPITAL_BASED_OUTPATIENT_CLINIC_OR_DEPARTMENT_OTHER): Payer: Medicare Other | Attending: Physician Assistant | Admitting: Internal Medicine

## 2016-03-22 VITALS — Ht 66.0 in | Wt 236.0 lb

## 2016-03-22 DIAGNOSIS — G4733 Obstructive sleep apnea (adult) (pediatric): Secondary | ICD-10-CM | POA: Insufficient documentation

## 2016-03-22 DIAGNOSIS — Z6838 Body mass index (BMI) 38.0-38.9, adult: Secondary | ICD-10-CM | POA: Diagnosis not present

## 2016-03-22 DIAGNOSIS — E669 Obesity, unspecified: Secondary | ICD-10-CM | POA: Diagnosis not present

## 2016-03-22 DIAGNOSIS — R0683 Snoring: Secondary | ICD-10-CM | POA: Diagnosis not present

## 2016-03-22 DIAGNOSIS — R5383 Other fatigue: Secondary | ICD-10-CM | POA: Diagnosis not present

## 2016-03-30 DIAGNOSIS — R5383 Other fatigue: Secondary | ICD-10-CM | POA: Diagnosis not present

## 2016-03-30 NOTE — Procedures (Signed)
   Patient Name: Rhonda Taylor, Rhonda Taylor Study Date: 03/22/2016 Gender: Female D.O.B: November 07, 1945 Age (years): 70 Referring Provider: Norva RiffleAshley Vanstory Height (inches): 63 Interpreting Physician: Jetty Duhamellinton Talisa Petrak MD, ABSM Weight (lbs): 236 RPSGT: Cherylann ParrDubili, Fred BMI: 42 MRN: [id number] Neck Size: 16.50 CLINICAL INFORMATION Sleep Study Type: NPSG Indication for sleep study: Fatigue, Obesity, Snoring, Witnessed Apneas Epworth Sleepiness Score: 14  SLEEP STUDY TECHNIQUE As per the AASM Manual for the Scoring of Sleep and Associated Events v2.3 (April 2016) with a hypopnea requiring 4% desaturations. The channels recorded and monitored were frontal, central and occipital EEG, electrooculogram (EOG), submentalis EMG (chin), nasal and oral airflow, thoracic and abdominal wall motion, anterior tibialis EMG, snore microphone, electrocardiogram, and pulse oximetry.  MEDICATIONS Medications self-administered by patient taken the night of the study : none reported  SLEEP ARCHITECTURE The study was initiated at 11:01:31 PM and ended at 5:02:13 AM. Sleep onset time was 26.4 minutes and the sleep efficiency was 69.4%. The total sleep time was 250.5 minutes. Stage REM latency was 292.5 minutes. The patient spent 8.58% of the night in stage N1 sleep, 89.42% in stage N2 sleep, 0.00% in stage N3 and 2.00% in REM. Alpha intrusion was absent. Supine sleep was 97.80%.  RESPIRATORY PARAMETERS The overall apnea/hypopnea index (AHI) was 8.4 per hour. There were 12 total apneas, including 12 obstructive, 0 central and 0 mixed apneas. There were 23 hypopneas and 22 RERAs. The AHI during Stage REM sleep was 0.0 per hour. AHI while supine was 8.1 per hour. The mean oxygen saturation was 94.39%. The minimum SpO2 during sleep was 84.00%. Loud snoring was noted during this study.  CARDIAC DATA The 2 lead EKG demonstrated sinus rhythm. The mean heart rate was 66.25 beats per minute. Other EKG findings include:  None.  LEG MOVEMENT DATA The total PLMS were 0 with a resulting PLMS index of 0.00. Associated arousal with leg movement index was 0.0 .  IMPRESSIONS - Mild obstructive sleep apnea occurred during this study (AHI = 8.4/h). - Insufficient early events to meet protocol requirements for split CPAP titration. - No significant central sleep apnea occurred during this study (CAI = 0.0/h). - Mild oxygen desaturation was noted during this study (Min O2 = 84.00%). - The patient snored with Loud snoring volume. - No cardiac abnormalities were noted during this study. - Clinically significant periodic limb movements did not occur during sleep. No significant associated arousals.  DIAGNOSIS - Obstructive Sleep Apnea (327.23 [G47.33 ICD-10])  RECOMMENDATIONS - CPAP titration or a fitted oral appliance may be appropriate. - Avoid alcohol, sedatives and other CNS depressants that may worsen sleep apnea and disrupt normal sleep architecture. - Sleep hygiene should be reviewed to assess factors that may improve sleep quality. - Weight management and regular exercise should be initiated or continued if appropriate.  [Electronically signed] 03/30/2016 10:33 AM  Jetty Duhamellinton Shaden Lacher MD, ABSM Diplomate, American Board of Sleep Medicine   NPI: [id number]  Waymon BudgeYOUNG,Roselind Klus D Diplomate, American Board of Sleep Medicine  ELECTRONICALLY SIGNED ON:  03/30/2016, 10:30 AM [facility] PH: [phone]   FX: [phone] ACCREDITED BY THE AMERICAN ACADEMY OF SLEEP MEDICINE

## 2016-04-08 DIAGNOSIS — K5730 Diverticulosis of large intestine without perforation or abscess without bleeding: Secondary | ICD-10-CM | POA: Diagnosis not present

## 2016-04-08 DIAGNOSIS — K2950 Unspecified chronic gastritis without bleeding: Secondary | ICD-10-CM | POA: Diagnosis not present

## 2016-04-08 DIAGNOSIS — K5909 Other constipation: Secondary | ICD-10-CM | POA: Diagnosis not present

## 2016-04-08 DIAGNOSIS — R1310 Dysphagia, unspecified: Secondary | ICD-10-CM | POA: Diagnosis not present

## 2016-04-08 DIAGNOSIS — K209 Esophagitis, unspecified: Secondary | ICD-10-CM | POA: Diagnosis not present

## 2016-04-08 DIAGNOSIS — R140 Abdominal distension (gaseous): Secondary | ICD-10-CM | POA: Diagnosis not present

## 2016-04-08 DIAGNOSIS — K219 Gastro-esophageal reflux disease without esophagitis: Secondary | ICD-10-CM | POA: Diagnosis not present

## 2016-04-08 DIAGNOSIS — K648 Other hemorrhoids: Secondary | ICD-10-CM | POA: Diagnosis not present

## 2016-04-08 DIAGNOSIS — R1013 Epigastric pain: Secondary | ICD-10-CM | POA: Diagnosis not present

## 2016-04-10 DIAGNOSIS — I10 Essential (primary) hypertension: Secondary | ICD-10-CM | POA: Diagnosis not present

## 2016-04-10 DIAGNOSIS — E785 Hyperlipidemia, unspecified: Secondary | ICD-10-CM | POA: Diagnosis not present

## 2016-04-10 DIAGNOSIS — Z794 Long term (current) use of insulin: Secondary | ICD-10-CM | POA: Diagnosis not present

## 2016-04-10 DIAGNOSIS — E1165 Type 2 diabetes mellitus with hyperglycemia: Secondary | ICD-10-CM | POA: Diagnosis not present

## 2016-04-21 DIAGNOSIS — E1165 Type 2 diabetes mellitus with hyperglycemia: Secondary | ICD-10-CM | POA: Diagnosis not present

## 2016-05-15 DIAGNOSIS — Z794 Long term (current) use of insulin: Secondary | ICD-10-CM | POA: Diagnosis not present

## 2016-05-15 DIAGNOSIS — E1165 Type 2 diabetes mellitus with hyperglycemia: Secondary | ICD-10-CM | POA: Diagnosis not present

## 2016-05-15 DIAGNOSIS — I10 Essential (primary) hypertension: Secondary | ICD-10-CM | POA: Diagnosis not present

## 2016-05-15 DIAGNOSIS — E785 Hyperlipidemia, unspecified: Secondary | ICD-10-CM | POA: Diagnosis not present

## 2016-05-27 ENCOUNTER — Other Ambulatory Visit (HOSPITAL_BASED_OUTPATIENT_CLINIC_OR_DEPARTMENT_OTHER): Payer: Self-pay

## 2016-05-27 DIAGNOSIS — I10 Essential (primary) hypertension: Secondary | ICD-10-CM | POA: Diagnosis not present

## 2016-05-27 DIAGNOSIS — R5383 Other fatigue: Secondary | ICD-10-CM

## 2016-05-27 DIAGNOSIS — Z794 Long term (current) use of insulin: Secondary | ICD-10-CM | POA: Diagnosis not present

## 2016-05-27 DIAGNOSIS — E1165 Type 2 diabetes mellitus with hyperglycemia: Secondary | ICD-10-CM | POA: Diagnosis not present

## 2016-05-27 DIAGNOSIS — E785 Hyperlipidemia, unspecified: Secondary | ICD-10-CM | POA: Diagnosis not present

## 2016-05-27 DIAGNOSIS — R0683 Snoring: Secondary | ICD-10-CM

## 2016-05-31 ENCOUNTER — Ambulatory Visit (HOSPITAL_BASED_OUTPATIENT_CLINIC_OR_DEPARTMENT_OTHER): Payer: Medicare Other | Attending: Physician Assistant | Admitting: Internal Medicine

## 2016-05-31 VITALS — Ht 63.0 in | Wt 236.0 lb

## 2016-05-31 DIAGNOSIS — G4733 Obstructive sleep apnea (adult) (pediatric): Secondary | ICD-10-CM

## 2016-05-31 DIAGNOSIS — R0683 Snoring: Secondary | ICD-10-CM | POA: Insufficient documentation

## 2016-05-31 DIAGNOSIS — M25562 Pain in left knee: Secondary | ICD-10-CM | POA: Diagnosis not present

## 2016-05-31 DIAGNOSIS — M1711 Unilateral primary osteoarthritis, right knee: Secondary | ICD-10-CM | POA: Diagnosis not present

## 2016-05-31 DIAGNOSIS — R5383 Other fatigue: Secondary | ICD-10-CM | POA: Insufficient documentation

## 2016-05-31 DIAGNOSIS — M5136 Other intervertebral disc degeneration, lumbar region: Secondary | ICD-10-CM | POA: Diagnosis not present

## 2016-06-03 ENCOUNTER — Institutional Professional Consult (permissible substitution): Payer: Medicare Other | Admitting: Pulmonary Disease

## 2016-06-15 DIAGNOSIS — R0683 Snoring: Secondary | ICD-10-CM | POA: Diagnosis not present

## 2016-06-15 NOTE — Procedures (Signed)
  Patient Name: Rhonda Taylor, [name] Date: 05/31/2016 Gender: Female D.O.B: [birth year] Age (years): 70 Referring Provider: Norva RiffleAshley Vanstory Height (inches): 63 Interpreting Physician: Jetty Duhamellinton Renesme Kerrigan MD, ABSM Weight (lbs): 236 RPSGT: Cherylann ParrDubili, Fred BMI: 42 MRN: [id number] Neck Size: 16.50 CLINICAL INFORMATION The patient is referred for a CPAP titration to treat sleep apnea.     Date of NPSG, Split Night or HST:  Diagnostic NPSG 03/22/16  AHI 8.4/ hr, desaturation to 84%, body weight 236 lbs  SLEEP STUDY TECHNIQUE As per the AASM Manual for the Scoring of Sleep and Associated Events v2.3 (April 2016) with a hypopnea requiring 4% desaturations.  The channels recorded and monitored were frontal, central and occipital EEG, electrooculogram (EOG), submentalis EMG (chin), nasal and oral airflow, thoracic and abdominal wall motion, anterior tibialis EMG, snore microphone, electrocardiogram, and pulse oximetry. Continuous positive airway pressure (CPAP) was initiated at the beginning of the study and titrated to treat sleep-disordered breathing.  MEDICATIONS Medications self-administered by patient taken the night of the study : none reported  TECHNICIAN COMMENTS Comments added by technician: Patient tolerated the cpap good. Patient had difficulty initiating sleep.  Comments added by scorer: N/A  RESPIRATORY PARAMETERS Optimal PAP Pressure (cm): 10 AHI at Optimal Pressure (/hr): 0.0 Overall Minimal O2 (%): 88.00 Supine % at Optimal Pressure (%): 100 Minimal O2 at Optimal Pressure (%): 91.0    SLEEP ARCHITECTURE The study was initiated at 10:56:17 PM and ended at 4:52:40 AM.  Sleep onset time was 68.0 minutes and the sleep efficiency was 76.2%. The total sleep time was 271.5 minutes.  The patient spent 9.58% of the night in stage N1 sleep, 67.77% in stage N2 sleep, 0.00% in stage N3 and 22.65% in REM.Stage REM latency was 77.0 minutes  Wake after sleep onset was 16.9. Alpha  intrusion was absent. Supine sleep was 100.00%.  CARDIAC DATA The 2 lead EKG demonstrated sinus rhythm. The mean heart rate was 63.36 beats per minute. Other EKG findings include: None.  LEG MOVEMENT DATA The total Periodic Limb Movements of Sleep (PLMS) were 0. The PLMS index was 0.00. A PLMS index of <15 is considered normal in adults.  IMPRESSIONS - The optimal PAP pressure was 10 cm of water. - Central sleep apnea was not noted during this titration (CAI = 0.0/h). - Mild oxygen desaturations were observed during this titration (min O2 = 88.00%). - No snoring was audible during this study. - No cardiac abnormalities were observed during this study. - Clinically significant periodic limb movements were not noted during this study. Arousals associated with PLMs were rare.  DIAGNOSIS - Obstructive Sleep Apnea (327.23 [G47.33 ICD-10])  RECOMMENDATIONS - Trial of CPAP therapy on 10 cm H2O with a Large size Resmed Full Face Mask AirFit F20 mask and heated humidification. - Avoid alcohol, sedatives and other CNS depressants that may worsen sleep apnea and disrupt normal sleep architecture. - Sleep hygiene should be reviewed to assess factors that may improve sleep quality. - Weight management and regular exercise should be initiated or continued.  [Electronically signed] 06/15/2016 10:18 AM  Jetty Duhamellinton Acea Yagi MD, ABSM Diplomate, American Board of Sleep Medicine   NPI: [id number]  Waymon BudgeYOUNG,Fredi Geiler D Diplomate, American Board of Sleep Medicine  ELECTRONICALLY SIGNED ON:  06/15/2016, 10:17 AM [facility] PH: [phone]   FX: [phone] ACCREDITED BY THE AMERICAN ACADEMY OF SLEEP MEDICINE

## 2016-06-27 ENCOUNTER — Encounter: Payer: Self-pay | Admitting: Pulmonary Disease

## 2016-06-27 ENCOUNTER — Ambulatory Visit (INDEPENDENT_AMBULATORY_CARE_PROVIDER_SITE_OTHER): Payer: Medicare Other | Admitting: Pulmonary Disease

## 2016-06-27 VITALS — BP 132/82 | HR 67 | Ht 63.0 in | Wt 237.6 lb

## 2016-06-27 DIAGNOSIS — G4733 Obstructive sleep apnea (adult) (pediatric): Secondary | ICD-10-CM

## 2016-06-27 NOTE — Assessment & Plan Note (Addendum)
Pt was diagnosed with OSA in 2010.  She was sleepy, snoring, had witnessed apneas,gas[ing, choking, unrefreshed sleep. Occasional sleep talking.  She used her cpap machine despite having having issues with it. She felt the pressure might be too much, a lot of leak, machine was noisy.  She felt better however over all.  She had a second cpap machine in 2015 since she needed an update. She uses nasal pillows.  Has a lot of leak.  It is working well however.  [place]. No supplies or download has been done since 2015. She literally just got the cpap machine in 2015.   Patient had a sleep study in 02/2016 which showed AHI 8.4. She had a cpap titration study done on 05/31/16 which showed she was corrected at 10 cm water.    Plan :  We extensively discussed the diagnosis, pathophysiology, and treatment options for Obstructive Sleep Apnea (OSA).   We discussed treatment options for OSA including CPAP, BiPaP, as well as surgical options and oral devices.   We will hook up pt to a DME company.  We will start CPAP 10 cm water. Her machine she got in 2015 and is still working well according to her. We'll get a download of previous data as well as download on CPAP 10 cm water. She will need mask fitting session as well as supplies. Encouraged her to continue using CPAP every day.  Patient was instructed to call the office if he/she has not received the PAP device in 1-2 weeks.  Patient was instructed to have mask, tubings, filter, reservoir cleaned at least once a week with soapy water.  Patient was instructed to call the office if he/she is having issues with the PAP device.    I advised patient to obtain sufficient amount of sleep --  7 to 8 hours at least in a 24 hr period.  Patient was advised to follow good sleep hygiene.  Patient was advised NOT to engage in activities requiring concentration and/or vigilance if he/she is and  sleepy.  Patient is NOT to drive if  he/she is sleepy.

## 2016-06-27 NOTE — Progress Notes (Signed)
Subjective:    Patient ID: Rhonda Taylor, female    DOB: 05-24-1945, 71 y.o.   MRN: [id number]  HPI   This is the case of Rhonda Taylor, 71 y.o. Female, who was referred by Dr. Julio Sicks in consultation regarding OSA.   As you very well know, patient is a non smoker, not known to have asthma or copd, was diagnosed with OSA in 2010.  She was sleepy, snoring, had witnessed apneas,gas[ing, choking, unrefreshed sleep. Occasional sleep talking.  She used her cpap machine despite having having issues with it. She felt the pressure might be too much, a lot of leak, machine was noisy.  She felt better however over all.  She had a second cpap machine in 2015 since she needed an update. She uses nasal pillows.  Has a lot of leak.  It is working well however.  She moved from [place] to [place]  5 months. No supplies or download has been done since 2015. She literally just got the cpap machine in 2015.   Patient had a sleep study in 02/2016 which showed AHI 8.4. She had a cpap titration study done on 05/31/16 which showed she was corrected at 10 cm water.     Review of Systems  Constitutional: Negative.  Negative for fever and unexpected weight change.  HENT: Negative.  Negative for congestion, dental problem, ear pain, nosebleeds, postnasal drip, rhinorrhea, sinus pressure, sneezing, sore throat and trouble swallowing.   Eyes: Negative for redness and itching.  Respiratory: Negative.  Negative for cough, chest tightness, shortness of breath and wheezing.   Cardiovascular: Positive for leg swelling. Negative for palpitations.  Gastrointestinal: Negative.  Negative for nausea and vomiting.  Endocrine: Negative.   Genitourinary: Negative.  Negative for dysuria.  Musculoskeletal: Positive for joint swelling.  Skin: Negative.  Negative for rash.  Allergic/Immunologic: Negative.  Negative for environmental allergies, food allergies and immunocompromised state.  Neurological: Positive for headaches.   Hematological: Bruises/bleeds easily.  Psychiatric/Behavioral: Negative.  Negative for dysphoric mood. The patient is not nervous/anxious.    Past Medical History:  Diagnosis Date  . Diabetes mellitus without complication (HCC)   . Hypertension    (-) CA, DVT (-) CVA  No family history on file.  Mother had DM Father had DM  Past Surgical History:  Procedure Laterality Date  . ABDOMINAL HYSTERECTOMY    . CARPAL TUNNEL RELEASE Right     Social History   Social History  . Marital status: Single    Spouse name: N/A  . Number of children: N/A  . Years of education: N/A   Occupational History  . Not on file.   Social History Main Topics  . Smoking status: Never Smoker  . Smokeless tobacco: Never Used  . Alcohol use No  . Drug use: No  . Sexual activity: No   Other Topics Concern  . Not on file   Social History Narrative  . No narrative on file   Has a son. Lives in [place], from [place] initially. She did data entry. (-) ETOH.   No Known Allergies   Outpatient Medications Prior to Visit  Medication Sig Dispense Refill  . aspirin EC 81 MG tablet Take 81 mg by mouth daily.    . insulin lispro (HUMALOG) 100 UNIT/ML injection Inject 25-30 Units into the skin 3 (three) times daily before meals. 25 units at 3pm    . polyethylene glycol (MIRALAX / GLYCOLAX) packet Take 17 g by mouth daily. 30 each 0  .  traMADol (ULTRAM) 50 MG tablet Take 50 mg by mouth every 6 (six) hours as needed for moderate pain.    Marland Kitchen. ALPRAZolam (XANAX) 0.5 MG tablet Take 0.5-1 mg by mouth daily as needed for anxiety.    Marland Kitchen. amLODipine (NORVASC) 5 MG tablet Take 5 mg by mouth daily.    Marland Kitchen. ibuprofen (ADVIL,MOTRIN) 800 MG tablet Take 800 mg by mouth every 8 (eight) hours as needed for moderate pain.    . metFORMIN (GLUCOPHAGE) 500 MG tablet Take 1 tablet (500 mg total) by mouth 2 (two) times daily with a meal. (Patient not taking: Reported on 06/27/2016) 60 tablet 0   No facility-administered medications prior to  visit.    Meds ordered this encounter  Medications  . gabapentin (NEURONTIN) 300 MG capsule    Sig: Take 300 mg by mouth 3 (three) times daily.  . valsartan (DIOVAN) 320 MG tablet    Sig: Take 320 mg by mouth.  Marland Kitchen. omeprazole (PRILOSEC) 40 MG capsule    Sig: Take 40 mg by mouth.  . furosemide (LASIX) 20 MG tablet    Sig: Take 20 mg by mouth.  . clonazePAM (KLONOPIN) 0.5 MG tablet    Sig: Take 0.5 mg by mouth 2 (two) times daily as needed for anxiety.  . diclofenac (VOLTAREN) 75 MG EC tablet    Sig: Take 75 mg by mouth 2 (two) times daily.  . Cholecalciferol (VITAMIN D3) 5000 units CAPS    Sig: Take by mouth.  Marland Kitchen. atorvastatin (LIPITOR) 20 MG tablet    Sig: Take 20 mg by mouth daily.         Objective:   Physical Exam  Vitals:  Vitals:   06/27/16 1148  BP: 132/82  Pulse: 67  SpO2: 97%  Weight: 237 lb 9.6 oz (107.8 kg)  Height: 5\' 3"  (1.6 m)    Constitutional/General:  Pleasant, well-nourished, well-developed, not in any distress,  Comfortably seating.  Well kempt  Body mass index is 42.09 kg/m. Wt Readings from Last 3 Encounters:  06/27/16 237 lb 9.6 oz (107.8 kg)  05/31/16 236 lb (107 kg)  03/22/16 236 lb (107 kg)    Neck: No masses. Midline trachea. No JVD, (-) LAD. (-) bruits appreciated.  Respiratory/Chest: Grossly normal chest. (-) deformity. (-) Accessory muscle use.  Symmetric expansion. (-) Tenderness on palpation.  Resonant on percussion.  Diminished BS on both lower lung zones. (-) wheezing, crackles, rhonchi (-) egophony  Cardiovascular: Regular rate and  rhythm, heart sounds normal, no murmur or gallops, no peripheral edema  Gastrointestinal:  Normal bowel sounds. Soft, non-tender. No hepatosplenomegaly.  (-) masses.   Musculoskeletal:  Normal muscle tone. Normal gait.   Extremities: Grossly normal. (-) clubbing, cyanosis.  (-) edema  Skin: (-) rash,lesions seen.   Neurological/Psychiatric : alert, oriented to time, place, person. Normal  mood and affect          Assessment & Plan:  OSA (obstructive sleep apnea) Pt was diagnosed with OSA in 2010.  She was sleepy, snoring, had witnessed apneas,gas[ing, choking, unrefreshed sleep. Occasional sleep talking.  She used her cpap machine despite having having issues with it. She felt the pressure might be too much, a lot of leak, machine was noisy.  She felt better however over all.  She had a second cpap machine in 2015 since she needed an update. She uses nasal pillows.  Has a lot of leak.  It is working well however.  [place]  [place]. No supplies or download has been done since 2015. She literally just got the cpap machine in 2015.   Patient had a sleep study in 02/2016 which showed AHI 8.4. She had a cpap titration study done on 05/31/16 which showed she was corrected at 10 cm water.    Plan :  We extensively discussed the diagnosis, pathophysiology, and treatment options for Obstructive Sleep Apnea (OSA).   We discussed treatment options for OSA including CPAP, BiPaP, as well as surgical options and oral devices.   We will hook up pt to a DME company.  We will start CPAP 10 cm water. Her machine she got in 2015 and is still working well according to her. We'll get a download of previous data as well as download on CPAP 10 cm water. She will need mask fitting session as well as supplies. Encouraged her to continue using CPAP every day.  Patient was instructed to call the office if he/she has not received the PAP device in 1-2 weeks.  Patient was instructed to have mask, tubings, filter, reservoir cleaned at least once a week with soapy water.  Patient was instructed to call the office if he/she is having issues with the PAP device.    I advised patient to obtain sufficient amount of sleep --  7 to 8 hours at least in a 24 hr period.  Patient was advised to follow good sleep hygiene.  Patient was advised NOT to engage in activities requiring  concentration and/or vigilance if he/she is and  sleepy.  Patient is NOT to drive if he/she is sleepy.         Thank you very much for letting me participate in this patient's care. Please do not hesitate to give me a call if you have any questions or concerns regarding the treatment plan.   Patient will follow up with me in 3 mos.     Pollie Meyer, MD 06/27/2016   12:18 PM Pulmonary and Critical Care Medicine [facility] Pager: [phone] Office: [phone], Fax: [phone]

## 2016-06-27 NOTE — Patient Instructions (Signed)
  It was a pleasure taking care of you today!  You are diagnosed with Obstructive Sleep Apnea or OSA.  You stop breathing  9   times an hour  We will set your current machine to CPAP 10 cm water.    Please make sure you use your CPAP device everytime you sleep.  We will monitor the usage of your machine per your insurance requirement.  Your insurance company may take the machine from you if you are not using it regularly.   Please clean the mask, tubings, filter, water reservoir with soapy water every week.  Please use distilled water for the water reservoir.   Please call the office or your machine provider (DME company) if you are having issues with the device.   Return to clinic in 3 months with NP.

## 2016-07-04 ENCOUNTER — Encounter (HOSPITAL_COMMUNITY): Payer: Self-pay | Admitting: Emergency Medicine

## 2016-07-04 ENCOUNTER — Ambulatory Visit (HOSPITAL_COMMUNITY)
Admission: EM | Admit: 2016-07-04 | Discharge: 2016-07-04 | Disposition: A | Discharge status: Home / Self Care | Payer: Medicare Other | Attending: Internal Medicine | Admitting: Internal Medicine

## 2016-07-04 DIAGNOSIS — L02415 Cutaneous abscess of right lower limb: Secondary | ICD-10-CM | POA: Diagnosis not present

## 2016-07-04 MED ORDER — SULFAMETHOXAZOLE-TRIMETHOPRIM 800-160 MG PO TABS: 1.0000 | ORAL_TABLET | Freq: Two times a day (BID) | ORAL | 0 refills | Status: AC

## 2016-07-04 NOTE — ED Triage Notes (Signed)
Seen  by provider only

## 2016-07-04 NOTE — ED Provider Notes (Signed)
CSN: [id number]     Arrival date & time 07/04/16  1953 History   First MD Initiated Contact with Patient 07/04/16 2056     No chief complaint on file.  (Consider location/radiation/quality/duration/timing/severity/associated sxs/prior Treatment) 71 year old female presents to the urgent care with a wound to the right anterior thigh. She states that several days ago she developed a small sore over the right anterior thigh that grew to a size that she describes as approximately 8-10 cm. It was hard and tender. 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 Since yesterday the size of the lesion has decreased in half. Now there is a 3 x 3 cm area of induration and only a small amount of pus is expressed followed by mostly blood.      Past Medical History:  Diagnosis Date  . Diabetes mellitus without complication (HCC)   . Hypertension    Past Surgical History:  Procedure Laterality Date  . ABDOMINAL HYSTERECTOMY    . CARPAL TUNNEL RELEASE Right    No family history on file. Social History  Substance Use Topics  . Smoking status: Never Smoker  . Smokeless tobacco: Never Used  . Alcohol use No   OB History    No data available     Review of Systems  Constitutional: Negative.   HENT: Negative.   Skin:       See history of present illness.  All other systems reviewed and are negative.   Allergies  Patient has no known allergies.  Home Medications   Prior to Admission medications   Medication Sig Start Date End Date Taking? Authorizing Provider  ALPRAZolam Prudy Feeler) 0.5 MG tablet Take 0.5-1 mg by mouth daily as needed for anxiety.    Historical Provider, MD  amLODipine (NORVASC) 5 MG tablet Take 5 mg by mouth daily.    Historical Provider, MD  aspirin EC 81 MG tablet Take 81 mg by mouth daily.    Historical Provider, MD  atorvastatin (LIPITOR) 20 MG tablet Take 20 mg by mouth daily.    Historical Provider, MD  Cholecalciferol (VITAMIN D3) 5000 units CAPS Take by  mouth.    Historical Provider, MD  clonazePAM (KLONOPIN) 0.5 MG tablet Take 0.5 mg by mouth 2 (two) times daily as needed for anxiety.    Historical Provider, MD  diclofenac (VOLTAREN) 75 MG EC tablet Take 75 mg by mouth 2 (two) times daily.    Historical Provider, MD  furosemide (LASIX) 20 MG tablet Take 20 mg by mouth.    Historical Provider, MD  gabapentin (NEURONTIN) 300 MG capsule Take 300 mg by mouth 3 (three) times daily.    Historical Provider, MD  ibuprofen (ADVIL,MOTRIN) 800 MG tablet Take 800 mg by mouth every 8 (eight) hours as needed for moderate pain.    Historical Provider, MD  insulin lispro (HUMALOG) 100 UNIT/ML injection Inject 25-30 Units into the skin 3 (three) times daily before meals. 25 units at 3pm    Historical Provider, MD  metFORMIN (GLUCOPHAGE) 500 MG tablet Take 1 tablet (500 mg total) by mouth 2 (two) times daily with a meal. Patient not taking: Reported on 06/27/2016 05/29/15   Dione Booze, MD  omeprazole (PRILOSEC) 40 MG capsule Take 40 mg by mouth. 03/08/16   Historical Provider, MD  polyethylene glycol (MIRALAX / GLYCOLAX) packet Take 17 g by mouth daily. 02/04/16   Eustace Moore, MD  sulfamethoxazole-trimethoprim (BACTRIM DS,SEPTRA DS) 800-160 MG tablet Take 1 tablet by mouth 2 (two) times daily.  07/04/16 07/11/16  Hayden Rasmussenavid Man Bonneau, NP  traMADol (ULTRAM) 50 MG tablet Take 50 mg by mouth every 6 (six) hours as needed for moderate pain.    Historical Provider, MD  valsartan (DIOVAN) 320 MG tablet Take 320 mg by mouth.    Historical Provider, MD   Meds Ordered and Administered this Visit  Medications - No data to display  BP (!) 146/85 (BP Location: Left Arm)   Pulse 65   Temp 97.6 F (36.4 C) (Oral)   Resp 14   SpO2 100%  No data found.   Physical Exam  Constitutional: She is oriented to person, place, and time. She appears well-developed and well-nourished.  Neck: Neck supple.  Neurological: She is alert and oriented to person, place, and time.  Skin: Skin is  warm and dry.  3 by 3 cm wide area of induration and overlying scaling 4/healing dermis. There are 2 fistulas one that is currently draining a very small amount of pus followed by some blood. Positive for tenderness. No surrounding erythema. According to the patient it is half the size it was yesterday. No lymphangitis or cellulitis.  Psychiatric: She has a normal mood and affect.  Nursing note and vitals reviewed.   Urgent Care Course     Procedures (including critical care time)  Labs Review Labs Reviewed - No data to display  Imaging Review No results found.   Visual Acuity Review  Right Eye Distance:   Left Eye Distance:   Bilateral Distance:    Right Eye Near:   Left Eye Near:    Bilateral Near:         MDM   1. Abscess of right thigh    Place very warm compresses over the wound for about 20 minutes at time 3-4 times a day. Puts pressure on the wound to squeeze out any remaining blood or pus. Be sure to wash cloth and soap and water and wash her hands frequently. Apply a Band-Aid over the wound to keep from spreading any drainage. Take the medication as directed. For any worsening, getting larger and more tender may return. Meds ordered this encounter  Medications  . sulfamethoxazole-trimethoprim (BACTRIM DS,SEPTRA DS) 800-160 MG tablet    Sig: Take 1 tablet by mouth 2 (two) times daily.    Dispense:  10 tablet    Refill:  0    Order Specific Question:   Supervising Provider    Answer:   Eustace MooreMURRAY, LAURA W [[id number]]   Culture pending    Hayden Rasmussenavid Densel Kronick, NP 07/04/16 2117

## 2016-07-04 NOTE — Discharge Instructions (Signed)
Place very warm compresses over the wound for about 20 minutes at time 3-4 times a day. Puts pressure on the wound to squeeze out any remaining blood or pus. Be sure to wash cloth and soap and water and wash her hands frequently. Apply a Band-Aid over the wound to keep from spreading any drainage. Take the medication as directed. For any worsening, getting larger and more tender may return.

## 2016-07-07 LAB — AEROBIC CULTURE W GRAM STAIN (SUPERFICIAL SPECIMEN): Culture: NORMAL

## 2016-07-07 LAB — AEROBIC CULTURE  (SUPERFICIAL SPECIMEN)

## 2016-07-10 DIAGNOSIS — E559 Vitamin D deficiency, unspecified: Secondary | ICD-10-CM | POA: Diagnosis not present

## 2016-07-10 DIAGNOSIS — E785 Hyperlipidemia, unspecified: Secondary | ICD-10-CM | POA: Diagnosis not present

## 2016-07-10 DIAGNOSIS — E1165 Type 2 diabetes mellitus with hyperglycemia: Secondary | ICD-10-CM | POA: Diagnosis not present

## 2016-07-10 DIAGNOSIS — Z794 Long term (current) use of insulin: Secondary | ICD-10-CM | POA: Diagnosis not present

## 2016-07-10 DIAGNOSIS — M170 Bilateral primary osteoarthritis of knee: Secondary | ICD-10-CM | POA: Diagnosis not present

## 2016-07-10 DIAGNOSIS — I10 Essential (primary) hypertension: Secondary | ICD-10-CM | POA: Diagnosis not present

## 2016-07-11 ENCOUNTER — Institutional Professional Consult (permissible substitution): Payer: Medicare Other | Admitting: Pulmonary Disease

## 2016-08-28 DIAGNOSIS — G4733 Obstructive sleep apnea (adult) (pediatric): Secondary | ICD-10-CM | POA: Diagnosis not present

## 2016-09-11 DIAGNOSIS — I10 Essential (primary) hypertension: Secondary | ICD-10-CM | POA: Diagnosis not present

## 2016-09-11 DIAGNOSIS — Z794 Long term (current) use of insulin: Secondary | ICD-10-CM | POA: Diagnosis not present

## 2016-09-11 DIAGNOSIS — E1165 Type 2 diabetes mellitus with hyperglycemia: Secondary | ICD-10-CM | POA: Diagnosis not present

## 2016-09-11 DIAGNOSIS — E785 Hyperlipidemia, unspecified: Secondary | ICD-10-CM | POA: Diagnosis not present

## 2016-09-18 ENCOUNTER — Telehealth: Payer: Self-pay | Admitting: Pulmonary Disease

## 2016-09-18 NOTE — Telephone Encounter (Signed)
[facility] and so [facility]  I will route now, thanks

## 2016-09-19 NOTE — Telephone Encounter (Signed)
Spoke to tyrone pt's son I gave him the phone # to [facility] and he is going to call them Rhonda Taylor

## 2016-09-19 NOTE — Telephone Encounter (Signed)
LMTCB Sally E Ottinger ° °

## 2016-09-19 NOTE — Telephone Encounter (Signed)
[name], patient's son, [place]. CB is [phone].

## 2016-09-20 ENCOUNTER — Encounter (HOSPITAL_COMMUNITY): Payer: Self-pay | Admitting: Emergency Medicine

## 2016-09-20 ENCOUNTER — Emergency Department (HOSPITAL_COMMUNITY)
Admission: EM | Admit: 2016-09-20 | Discharge: 2016-09-20 | Disposition: A | Discharge status: Home / Self Care | Payer: Medicare Other | Attending: Emergency Medicine | Admitting: Emergency Medicine

## 2016-09-20 ENCOUNTER — Emergency Department (HOSPITAL_COMMUNITY): Payer: Medicare Other

## 2016-09-20 DIAGNOSIS — Z79899 Other long term (current) drug therapy: Secondary | ICD-10-CM | POA: Diagnosis not present

## 2016-09-20 DIAGNOSIS — Z7982 Long term (current) use of aspirin: Secondary | ICD-10-CM | POA: Insufficient documentation

## 2016-09-20 DIAGNOSIS — Y92008 Other place in unspecified non-institutional (private) residence as the place of occurrence of the external cause: Secondary | ICD-10-CM | POA: Diagnosis not present

## 2016-09-20 DIAGNOSIS — Y999 Unspecified external cause status: Secondary | ICD-10-CM | POA: Insufficient documentation

## 2016-09-20 DIAGNOSIS — E119 Type 2 diabetes mellitus without complications: Secondary | ICD-10-CM | POA: Diagnosis not present

## 2016-09-20 DIAGNOSIS — R404 Transient alteration of awareness: Secondary | ICD-10-CM | POA: Diagnosis not present

## 2016-09-20 DIAGNOSIS — I10 Essential (primary) hypertension: Secondary | ICD-10-CM | POA: Insufficient documentation

## 2016-09-20 DIAGNOSIS — W19XXXA Unspecified fall, initial encounter: Secondary | ICD-10-CM

## 2016-09-20 DIAGNOSIS — M25551 Pain in right hip: Secondary | ICD-10-CM | POA: Diagnosis not present

## 2016-09-20 DIAGNOSIS — R42 Dizziness and giddiness: Secondary | ICD-10-CM | POA: Diagnosis not present

## 2016-09-20 DIAGNOSIS — W010XXA Fall on same level from slipping, tripping and stumbling without subsequent striking against object, initial encounter: Secondary | ICD-10-CM | POA: Insufficient documentation

## 2016-09-20 DIAGNOSIS — R079 Chest pain, unspecified: Secondary | ICD-10-CM | POA: Diagnosis not present

## 2016-09-20 DIAGNOSIS — M170 Bilateral primary osteoarthritis of knee: Secondary | ICD-10-CM | POA: Diagnosis not present

## 2016-09-20 DIAGNOSIS — M2550 Pain in unspecified joint: Secondary | ICD-10-CM | POA: Diagnosis not present

## 2016-09-20 DIAGNOSIS — S299XXA Unspecified injury of thorax, initial encounter: Secondary | ICD-10-CM | POA: Diagnosis not present

## 2016-09-20 DIAGNOSIS — Y9301 Activity, walking, marching and hiking: Secondary | ICD-10-CM | POA: Diagnosis not present

## 2016-09-20 DIAGNOSIS — R52 Pain, unspecified: Secondary | ICD-10-CM

## 2016-09-20 DIAGNOSIS — Z794 Long term (current) use of insulin: Secondary | ICD-10-CM | POA: Diagnosis not present

## 2016-09-20 DIAGNOSIS — S79911A Unspecified injury of right hip, initial encounter: Secondary | ICD-10-CM | POA: Diagnosis not present

## 2016-09-20 LAB — URINALYSIS, ROUTINE W REFLEX MICROSCOPIC
BILIRUBIN URINE: NEGATIVE
Glucose, UA: >=500 mg/dL — AB
Hgb urine dipstick: NEGATIVE
KETONES UR: 5 mg/dL — AB
Leukocytes, UA: NEGATIVE
Nitrite: NEGATIVE
PROTEIN: NEGATIVE mg/dL
Specific Gravity, Urine: 1.018 (ref 1.005–1.030)
pH: 6 (ref 5.0–8.0)

## 2016-09-20 LAB — CBC
HEMATOCRIT: 39.6 % (ref 36.0–46.0)
HEMOGLOBIN: 13.2 g/dL (ref 12.0–15.0)
MCH: 26.9 pg (ref 26.0–34.0)
MCHC: 33.3 g/dL (ref 30.0–36.0)
MCV: 80.7 fL (ref 78.0–100.0)
Platelets: 172 10*3/uL (ref 150–400)
RBC: 4.91 MIL/uL (ref 3.87–5.11)
RDW: 13.6 % (ref 11.5–15.5)
WBC: 7.3 10*3/uL (ref 4.0–10.5)

## 2016-09-20 LAB — BASIC METABOLIC PANEL
ANION GAP: 10 (ref 5–15)
BUN: 13 mg/dL (ref 6–20)
CO2: 26 mmol/L (ref 22–32)
Calcium: 9.4 mg/dL (ref 8.9–10.3)
Chloride: 102 mmol/L (ref 101–111)
Creatinine, Ser: 1.05 mg/dL — ABNORMAL HIGH (ref 0.44–1.00)
GFR calc Af Amer: >60 mL/min (ref >60)
GFR, EST NON AFRICAN AMERICAN: 53 mL/min — AB (ref >60)
GLUCOSE: 219 mg/dL — AB (ref 65–99)
Potassium: 3.6 mmol/L (ref 3.5–5.1)
Sodium: 138 mmol/L (ref 135–145)

## 2016-09-20 LAB — CBG MONITORING, ED: GLUCOSE-CAPILLARY: 199 mg/dL — AB (ref 65–99)

## 2016-09-20 LAB — I-STAT TROPONIN, ED: TROPONIN I, POC: 0 ng/mL (ref 0.00–0.08)

## 2016-09-20 NOTE — ED Provider Notes (Signed)
[facility]-EMERGENCY DEPT Provider Note   CSN: [id number] Arrival date & time: 09/20/16  1804     History   Chief Complaint Chief Complaint  Patient presents with  . Fall    HPI Rhonda Taylor is a 70 y.o. female.  Patient states that she had steroid injections to bilateral knees and hips 2 weeks ago for osteoarthritis. States that she has had recurrent falls since that time. Walks with a walker, but does not use inside of her house. Walking across the living room, fall "don't know why". No head injury or loss of consciousness. Able to get up on her own.    Fall  This is a recurrent problem. Episode onset: 5-6 falls over the past 2 weeks. Two falls that occurred today. The problem occurs every several days. The problem has not changed since onset.Pertinent negatives include no chest pain, no abdominal pain, no headaches and no shortness of breath. Nothing aggravates the symptoms. Nothing relieves the symptoms. She has tried nothing for the symptoms. The treatment provided no relief.    Past Medical History:  Diagnosis Date  . Diabetes mellitus without complication (HCC)   . Hypertension     Patient Active Problem List   Diagnosis Date Noted  . OSA (obstructive sleep apnea) 06/27/2016    Past Surgical History:  Procedure Laterality Date  . ABDOMINAL HYSTERECTOMY    . CARPAL TUNNEL RELEASE Right     OB History    No data available       Home Medications    Prior to Admission medications   Medication Sig Start Date End Date Taking? Authorizing Provider  ALPRAZolam Prudy Feeler) 0.5 MG tablet Take 0.5-1 mg by mouth daily as needed for anxiety.    [provider]  amLODipine (NORVASC) 5 MG tablet Take 5 mg by mouth daily.    [provider]  aspirin EC 81 MG tablet Take 81 mg by mouth daily.    [provider]  atorvastatin (LIPITOR) 20 MG tablet Take 20 mg by mouth daily.    [provider]  Cholecalciferol (VITAMIN D3) 5000 units CAPS Take  by mouth.    [provider]  clonazePAM (KLONOPIN) 0.5 MG tablet Take 0.5 mg by mouth 2 (two) times daily as needed for anxiety.    [provider]  diclofenac (VOLTAREN) 75 MG EC tablet Take 75 mg by mouth 2 (two) times daily.    [provider]  furosemide (LASIX) 20 MG tablet Take 20 mg by mouth.    [provider]  gabapentin (NEURONTIN) 300 MG capsule Take 300 mg by mouth 3 (three) times daily.    [provider]  ibuprofen (ADVIL,MOTRIN) 800 MG tablet Take 800 mg by mouth every 8 (eight) hours as needed for moderate pain.    [provider]  insulin lispro (HUMALOG) 100 UNIT/ML injection Inject 25-30 Units into the skin 3 (three) times daily before meals. 25 units at 3pm    [provider]  metFORMIN (GLUCOPHAGE) 500 MG tablet Take 1 tablet (500 mg total) by mouth 2 (two) times daily with a meal. Patient not taking: Reported on 06/27/2016 05/29/15   Dione Booze, MD  omeprazole (PRILOSEC) 40 MG capsule Take 40 mg by mouth. 03/08/16   [provider]  polyethylene glycol (MIRALAX / GLYCOLAX) packet Take 17 g by mouth daily. 02/04/16   Eustace Moore, MD  traMADol (ULTRAM) 50 MG tablet Take 50 mg by mouth every 6 (six) hours as needed for  moderate pain.    [provider]  valsartan (DIOVAN) 320 MG tablet Take 320 mg by mouth.    [provider]    Family History No family history on file.  Social History Social History  Substance Use Topics  . Smoking status: Never Smoker  . Smokeless tobacco: Never Used  . Alcohol use No     Allergies   Patient has no known allergies.   Review of Systems Review of Systems  Constitutional: Negative for appetite change and fever.  HENT: Negative for congestion.   Respiratory: Negative for cough, chest tightness and shortness of breath.   Cardiovascular: Negative for chest pain.  Gastrointestinal: Negative for abdominal pain, diarrhea and vomiting.    Genitourinary: Negative for dysuria, flank pain and hematuria.  Musculoskeletal: Positive for arthralgias and gait problem. Negative for back pain.  Skin: Negative for rash and wound.  Neurological: Negative for dizziness, seizures, weakness, light-headedness and headaches.  Psychiatric/Behavioral: Negative for behavioral problems.     Physical Exam Updated Vital Signs BP (!) 150/60 (BP Location: Right Arm)   Pulse 61   Temp 98 F (36.7 C) (Oral)   Resp 16   SpO2 100%   Physical Exam  Constitutional: She is oriented to person, place, and time. She appears well-developed and well-nourished.  HENT:  Head: Atraumatic.  Mouth/Throat: Oropharynx is clear and moist.  Eyes: Conjunctivae and EOM are normal.  Neck: Normal range of motion. Neck supple.  Cardiovascular: Normal rate, regular rhythm, normal heart sounds and intact distal pulses.   Pulmonary/Chest: Effort normal and breath sounds normal. No respiratory distress. She has no wheezes.  Abdominal: She exhibits no distension. There is no tenderness.  Musculoskeletal: Normal range of motion. She exhibits tenderness ( Mild tenderness to the right hip, able to have full range of motion of the right leg.).  Neurological: She is alert and oriented to person, place, and time. She has normal strength and normal reflexes. No cranial nerve deficit or sensory deficit. Coordination normal. GCS eye subscore is 4. GCS verbal subscore is 5. GCS motor subscore is 6.  Skin: Skin is warm. Capillary refill takes less than 2 seconds. No pallor.  Psychiatric: She has a normal mood and affect.     ED Treatments / Results  Labs (all labs ordered are listed, but only abnormal results are displayed) Labs Reviewed  BASIC METABOLIC PANEL - Abnormal; Notable for the following:       Result Value   Glucose, Bld 219 (*)    Creatinine, Ser 1.05 (*)    GFR calc non Af Amer 53 (*)    All other components within normal limits  URINALYSIS, ROUTINE W REFLEX  MICROSCOPIC - Abnormal; Notable for the following:    APPearance HAZY (*)    Glucose, UA >=500 (*)    Ketones, ur 5 (*)    Bacteria, UA RARE (*)    Squamous Epithelial / LPF 0-5 (*)    All other components within normal limits  CBG MONITORING, ED - Abnormal; Notable for the following:    Glucose-Capillary 199 (*)    All other components within normal limits  CBC  I-STAT TROPOININ, ED    EKG  EKG Interpretation  Date/Time:  Friday September 20 2016 18:19:32 EDT Ventricular Rate:  57 PR Interval:    QRS Duration: 97 QT Interval:  414 QTC Calculation: 404 R Axis:   -14 Text Interpretation:  Sinus rhythm Nonspecific T abnormalities, inferior leads Abnormal ekg Confirmed by Gerhard MunchLockwood, Robert (  [id number]) on 09/20/2016 7:08:02 PM       Radiology Dg Chest 1 View  Result Date: 09/20/2016 CLINICAL DATA:  Pain after fall EXAM: CHEST 1 VIEW COMPARISON:  02/04/2016 FINDINGS: The heart size and mediastinal contours are within normal limits. Aortic atherosclerosis without aneurysm. Both lungs are clear. The visualized skeletal structures are unremarkable. IMPRESSION: No active disease.  Aortic atherosclerosis. Electronically Signed   By: Tollie Eth M.D.   On: 09/20/2016 19:40   Dg Pelvis 1-2 Views  Result Date: 09/20/2016 CLINICAL DATA:  Right hip pain after fall EXAM: PELVIS - 1-2 VIEW COMPARISON:  None. FINDINGS: There is degenerative disc disease of the included lumbar spine at L4-5 and L5-S1 with associated facet arthropathy. The bony pelvis appears intact. Both hip joints are maintained with slight joint space narrowing on the right, relative to left. Phleboliths are noted within the pelvis bilaterally. No acute fracture is identified. No diastasis of the bony pelvis. The SI joints and pubic symphysis are maintained. IMPRESSION: Negative for acute pelvic fracture. No hip fracture dislocation. Lower lumbar degenerative disc and facet arthropathy from L4 through S1. Electronically Signed   By: Tollie Eth  M.D.   On: 09/20/2016 19:40    Procedures Procedures (including critical care time)  Medications Ordered in ED Medications - No data to display   Initial Impression / Assessment and Plan / ED Course  I have reviewed the triage vital signs and the nursing notes.  Pertinent labs & imaging results that were available during my care of the patient were reviewed by me and considered in my medical decision making (see chart for details).     Patient is a 71 year old female with  past history of hypertension, diabetes, who presents to the emergency department following 2 falls that occurred today. Head injury, no LOC. No preceding symptoms of chest pain, lightheadedness, shortness of breath, headache, dizziness. Arrival distress, not ill-appearing. Afebrile, hemodynamically stable. Heart rate 58. Exam notable for normal neurologic exam, no signs of trauma, benign abdominal exam. Recent injections of steroids to bilateral knees and hips for osteoarthritis.  EKG showed nonspecific ST changes. Normal intervals. No chamber enlargement. No prior EKGs to compare. Lab work remarkable for no significant left abnormalities. Troponin undetectable. X-ray of her chest and pelvis obtained to evaluate for any signs of fractures. X-rays show no acute findings. UA shows no signs of infectious process.  Patient able to ambulate in the emergency department. Uses a walker for assistance. Doubt ACS, no chest pain, negative troponin, EKG without obvious acute ischemia. Doubt CVA, no strokelike symptoms. Doubt intraparenchymal hemorrhage, no headache, normal neurologic exam. No signs or symptoms of infectious etiology. Patient most likely had a fall secondary to pain from osteoarthritis reversed polypharmacy, patient is on Xanax. Patient with intermittent dizziness -present for 2 weeks.   Patient stable for discharge home. Given strict return precautions to the emergency department. Discussed follow-up with primary care  physician to evaluate some of her home medications as well as the etiologies of her fall. Also given follow-up information with neurology for the recurrent falls and dizziness. Patient expressed understanding, no questions or concerns at time of discharge. Final Clinical Impressions(s) / ED Diagnoses   Final diagnoses:  Fall, initial encounter    New Prescriptions Discharge Medication List as of 09/20/2016  9:39 PM       Corena Herter, MD 09/21/16 0009    Gerhard Munch, MD 09/22/16 2311

## 2016-09-20 NOTE — ED Triage Notes (Signed)
Per [facility], pt from home lives alone, had several falls today, pt denied dizziness until AFTER fall, pt denies any symptomjs at this time, no obvious injuries anywhere, denies pain, states she fell on her bottom. Pt denies LOC. AAOX4. VSS. Pt did not eat much today, is diabetic, sugar is 250.

## 2016-09-20 NOTE — ED Notes (Signed)
Pt placed on bedpan

## 2016-09-27 ENCOUNTER — Ambulatory Visit: Payer: Medicare Other | Admitting: Adult Health

## 2016-10-04 DIAGNOSIS — I10 Essential (primary) hypertension: Secondary | ICD-10-CM | POA: Diagnosis not present

## 2016-10-04 DIAGNOSIS — R609 Edema, unspecified: Secondary | ICD-10-CM | POA: Diagnosis not present

## 2016-10-22 DIAGNOSIS — H538 Other visual disturbances: Secondary | ICD-10-CM | POA: Diagnosis not present

## 2016-10-22 DIAGNOSIS — I10 Essential (primary) hypertension: Secondary | ICD-10-CM | POA: Diagnosis not present

## 2016-10-22 DIAGNOSIS — Z5181 Encounter for therapeutic drug level monitoring: Secondary | ICD-10-CM | POA: Diagnosis not present

## 2016-10-22 DIAGNOSIS — Z01118 Encounter for examination of ears and hearing with other abnormal findings: Secondary | ICD-10-CM | POA: Diagnosis not present

## 2016-10-22 DIAGNOSIS — E1165 Type 2 diabetes mellitus with hyperglycemia: Secondary | ICD-10-CM | POA: Diagnosis not present

## 2016-10-22 DIAGNOSIS — E559 Vitamin D deficiency, unspecified: Secondary | ICD-10-CM | POA: Diagnosis not present

## 2016-10-22 DIAGNOSIS — Z0000 Encounter for general adult medical examination without abnormal findings: Secondary | ICD-10-CM | POA: Diagnosis not present

## 2016-11-01 ENCOUNTER — Ambulatory Visit: Payer: Medicare Other | Admitting: Adult Health

## 2016-11-05 DIAGNOSIS — E1165 Type 2 diabetes mellitus with hyperglycemia: Secondary | ICD-10-CM | POA: Diagnosis not present

## 2016-11-05 DIAGNOSIS — Z0000 Encounter for general adult medical examination without abnormal findings: Secondary | ICD-10-CM | POA: Diagnosis not present

## 2016-12-04 ENCOUNTER — Ambulatory Visit (INDEPENDENT_AMBULATORY_CARE_PROVIDER_SITE_OTHER): Payer: Medicare Other | Admitting: Adult Health

## 2016-12-04 ENCOUNTER — Encounter: Payer: Self-pay | Admitting: Adult Health

## 2016-12-04 DIAGNOSIS — G4733 Obstructive sleep apnea (adult) (pediatric): Secondary | ICD-10-CM

## 2016-12-04 DIAGNOSIS — E668 Other obesity: Secondary | ICD-10-CM | POA: Diagnosis not present

## 2016-12-04 NOTE — Addendum Note (Signed)
Addended by: Boone MasterJONES, Darick Fetters E on: 12/04/2016 09:56 AM   Modules accepted: Orders

## 2016-12-04 NOTE — Patient Instructions (Addendum)
Continue on C Pap at bedtime. Order for mask fitting .  Wear for at least 4-6 hours each night Work on weight loss Do not drive a sleepy Follow-up in 4 months and As needed

## 2016-12-04 NOTE — Progress Notes (Signed)
@Patient  ID: Rhonda Taylor, female    DOB: [birth year], 71 y.o.   MRN: [id number]  Chief Complaint  Patient presents with  . Follow-up    OSA     Referring provider: Jackie Plum, MD  HPI: 71 year old female followed for obstructive sleep apnea Has DM   TEST  NPSG 03/2016 AHI 8.4 CPAP titration 05/2016 >optimal pressure 10cmH20   12/04/2016 Follow up : OSA  Patient returns for a follow-up for sleep apnea. Patient has noted mild sleep apnea is on C Pap at bedtime. Patient says she has not been able to wear it lately. She got a new mask that does not fit.  Download was requested. Patient does feel tired during daytime . Marland Kitchen Discussed weight loss.     No Known Allergies  Immunization History  Administered Date(s) Administered  . Influenza-Unspecified 05/01/2015    Past Medical History:  Diagnosis Date  . Diabetes mellitus without complication (HCC)   . Hypertension     Tobacco History: History  Smoking Status  . Never Smoker  Smokeless Tobacco  . Never Used   Counseling given: Not Answered   Outpatient Encounter Prescriptions as of 12/04/2016  Medication Sig  . Cholecalciferol (VITAMIN D3) 5000 units CAPS Take by mouth.  . metFORMIN (GLUCOPHAGE) 500 MG tablet Take 1 tablet (500 mg total) by mouth 2 (two) times daily with a meal.  . ALPRAZolam (XANAX) 0.5 MG tablet Take 0.5-1 mg by mouth daily as needed for anxiety.  Marland Kitchen amLODipine (NORVASC) 5 MG tablet Take 5 mg by mouth daily.  Marland Kitchen aspirin EC 81 MG tablet Take 81 mg by mouth daily.  Marland Kitchen atorvastatin (LIPITOR) 20 MG tablet Take 20 mg by mouth daily.  . clonazePAM (KLONOPIN) 0.5 MG tablet Take 0.5 mg by mouth 2 (two) times daily as needed for anxiety.  . diclofenac (VOLTAREN) 75 MG EC tablet Take 75 mg by mouth 2 (two) times daily.  . furosemide (LASIX) 20 MG tablet Take 20 mg by mouth.  . gabapentin (NEURONTIN) 300 MG capsule Take 300 mg by mouth 3 (three) times daily.  Marland Kitchen ibuprofen (ADVIL,MOTRIN) 800 MG tablet  Take 800 mg by mouth every 8 (eight) hours as needed for moderate pain.  Marland Kitchen insulin lispro (HUMALOG) 100 UNIT/ML injection Inject 25-30 Units into the skin 3 (three) times daily before meals. 25 units at 3pm  . omeprazole (PRILOSEC) 40 MG capsule Take 40 mg by mouth.  . polyethylene glycol (MIRALAX / GLYCOLAX) packet Take 17 g by mouth daily.  . traMADol (ULTRAM) 50 MG tablet Take 50 mg by mouth every 6 (six) hours as needed for moderate pain.  . valsartan (DIOVAN) 320 MG tablet Take 320 mg by mouth.   No facility-administered encounter medications on file as of 12/04/2016.      Review of Systems  Constitutional:   No  weight loss, night sweats,  Fevers, chills, +fatigue, or  lassitude.  HEENT:   No headaches,  Difficulty swallowing,  Tooth/dental problems, or  Sore throat,                No sneezing, itching, ear ache, nasal congestion, post nasal drip,   CV:  No chest pain,  Orthopnea, PND, swelling in lower extremities, anasarca, dizziness, palpitations, syncope.   GI  No heartburn, indigestion, abdominal pain, nausea, vomiting, diarrhea, change in bowel habits, loss of appetite, bloody stools.   Resp: No shortness of breath with exertion or at rest.  No excess mucus, no productive cough,  No  non-productive cough,  No coughing up of blood.  No change in color of mucus.  No wheezing.  No chest wall deformity  Skin: no rash or lesions.  GU: no dysuria, change in color of urine, no urgency or frequency.  No flank pain, no hematuria   MS:  No joint pain or swelling.  No decreased range of motion.  No back pain.    Physical Exam  BP 114/70 (BP Location: Left Arm, Patient Position: Sitting, Cuff Size: Normal)   Pulse 60   Ht 5\' 3"  (1.6 m)   Wt 237 lb (107.5 kg)   SpO2 98%   BMI 41.98 kg/m   GEN: A/Ox3; pleasant , NAD, obese, walker    HEENT:  /AT,  EACs-clear, TMs-wnl, NOSE-clear, THROAT-clear, no lesions, no postnasal drip or exudate noted. Class 3 MP airway   NECK:   Supple w/ fair ROM; no JVD; normal carotid impulses w/o bruits; no thyromegaly or nodules palpated; no lymphadenopathy.    RESP  Clear  P & A; w/o, wheezes/ rales/ or rhonchi. no accessory muscle use, no dullness to percussion  CARD:  RRR, no m/r/g, tr 1+ peripheral edema, pulses intact, no cyanosis or clubbing.  GI:   Soft & nt; nml bowel sounds; no organomegaly or masses detected.   Musco: Warm bil, no deformities or joint swelling noted.   Neuro: alert, no focal deficits noted.    Skin: Warm, no lesions or rashes    Lab Results:  CBC  Imaging: No results found.   Assessment & Plan:   OSA (obstructive sleep apnea) Well controlled on CPAP  Download requested.   Plan  Patient Instructions  Continue on C Pap at bedtime. Order for mask fitting .  Wear for at least 4-6 hours each night Work on weight loss Do not drive a sleepy Follow-up in 4 months and As needed       Moderate obesity Wt loss      Rubye Oaksammy Meng [place], NP 12/04/2016

## 2016-12-04 NOTE — Assessment & Plan Note (Signed)
Wt loss  

## 2016-12-04 NOTE — Assessment & Plan Note (Addendum)
Well controlled on CPAP  Download requested.   Plan  Patient Instructions  Continue on C Pap at bedtime. Order for mask fitting .  Wear for at least 4-6 hours each night Work on weight loss Do not drive a sleepy Follow-up in 4 months and As needed

## 2016-12-24 DIAGNOSIS — I10 Essential (primary) hypertension: Secondary | ICD-10-CM | POA: Diagnosis not present

## 2016-12-24 DIAGNOSIS — E1165 Type 2 diabetes mellitus with hyperglycemia: Secondary | ICD-10-CM | POA: Diagnosis not present

## 2016-12-24 DIAGNOSIS — K219 Gastro-esophageal reflux disease without esophagitis: Secondary | ICD-10-CM | POA: Diagnosis not present

## 2016-12-24 DIAGNOSIS — E785 Hyperlipidemia, unspecified: Secondary | ICD-10-CM | POA: Diagnosis not present

## 2017-02-12 ENCOUNTER — Ambulatory Visit: Payer: Self-pay | Admitting: Nurse Practitioner

## 2017-03-10 DIAGNOSIS — Z1231 Encounter for screening mammogram for malignant neoplasm of breast: Secondary | ICD-10-CM | POA: Diagnosis not present

## 2017-03-10 DIAGNOSIS — R350 Frequency of micturition: Secondary | ICD-10-CM | POA: Diagnosis not present

## 2017-03-10 DIAGNOSIS — Z01419 Encounter for gynecological examination (general) (routine) without abnormal findings: Secondary | ICD-10-CM | POA: Diagnosis not present

## 2017-05-06 DIAGNOSIS — E785 Hyperlipidemia, unspecified: Secondary | ICD-10-CM | POA: Diagnosis not present

## 2017-05-06 DIAGNOSIS — R6889 Other general symptoms and signs: Secondary | ICD-10-CM | POA: Diagnosis not present

## 2017-05-06 DIAGNOSIS — I10 Essential (primary) hypertension: Secondary | ICD-10-CM | POA: Diagnosis not present

## 2017-05-06 DIAGNOSIS — E1165 Type 2 diabetes mellitus with hyperglycemia: Secondary | ICD-10-CM | POA: Diagnosis not present

## 2017-05-06 DIAGNOSIS — K219 Gastro-esophageal reflux disease without esophagitis: Secondary | ICD-10-CM | POA: Diagnosis not present

## 2017-05-27 DIAGNOSIS — R6889 Other general symptoms and signs: Secondary | ICD-10-CM | POA: Diagnosis not present

## 2017-05-27 DIAGNOSIS — Z794 Long term (current) use of insulin: Secondary | ICD-10-CM | POA: Diagnosis not present

## 2017-05-27 DIAGNOSIS — E1165 Type 2 diabetes mellitus with hyperglycemia: Secondary | ICD-10-CM | POA: Diagnosis not present

## 2017-05-27 DIAGNOSIS — I10 Essential (primary) hypertension: Secondary | ICD-10-CM | POA: Diagnosis not present

## 2017-05-27 DIAGNOSIS — E785 Hyperlipidemia, unspecified: Secondary | ICD-10-CM | POA: Diagnosis not present

## 2017-06-17 DIAGNOSIS — R1031 Right lower quadrant pain: Secondary | ICD-10-CM | POA: Diagnosis not present

## 2017-06-17 DIAGNOSIS — N816 Rectocele: Secondary | ICD-10-CM | POA: Diagnosis not present

## 2017-06-17 DIAGNOSIS — K5900 Constipation, unspecified: Secondary | ICD-10-CM | POA: Diagnosis not present

## 2017-06-23 DIAGNOSIS — G4733 Obstructive sleep apnea (adult) (pediatric): Secondary | ICD-10-CM | POA: Diagnosis not present

## 2017-07-01 DIAGNOSIS — K219 Gastro-esophageal reflux disease without esophagitis: Secondary | ICD-10-CM | POA: Diagnosis not present

## 2017-07-01 DIAGNOSIS — M545 Low back pain: Secondary | ICD-10-CM | POA: Diagnosis not present

## 2017-07-01 DIAGNOSIS — E1165 Type 2 diabetes mellitus with hyperglycemia: Secondary | ICD-10-CM | POA: Diagnosis not present

## 2017-07-01 DIAGNOSIS — I10 Essential (primary) hypertension: Secondary | ICD-10-CM | POA: Diagnosis not present

## 2017-07-01 DIAGNOSIS — E785 Hyperlipidemia, unspecified: Secondary | ICD-10-CM | POA: Diagnosis not present

## 2017-08-19 DIAGNOSIS — E785 Hyperlipidemia, unspecified: Secondary | ICD-10-CM | POA: Diagnosis not present

## 2017-08-19 DIAGNOSIS — I10 Essential (primary) hypertension: Secondary | ICD-10-CM | POA: Diagnosis not present

## 2017-08-19 DIAGNOSIS — K219 Gastro-esophageal reflux disease without esophagitis: Secondary | ICD-10-CM | POA: Diagnosis not present

## 2017-08-19 DIAGNOSIS — E1165 Type 2 diabetes mellitus with hyperglycemia: Secondary | ICD-10-CM | POA: Diagnosis not present

## 2017-08-19 DIAGNOSIS — E559 Vitamin D deficiency, unspecified: Secondary | ICD-10-CM | POA: Diagnosis not present

## 2017-09-30 DIAGNOSIS — Z0000 Encounter for general adult medical examination without abnormal findings: Secondary | ICD-10-CM | POA: Diagnosis not present

## 2017-09-30 DIAGNOSIS — G4730 Sleep apnea, unspecified: Secondary | ICD-10-CM | POA: Diagnosis not present

## 2017-09-30 DIAGNOSIS — E1165 Type 2 diabetes mellitus with hyperglycemia: Secondary | ICD-10-CM | POA: Diagnosis not present

## 2017-09-30 DIAGNOSIS — I10 Essential (primary) hypertension: Secondary | ICD-10-CM | POA: Diagnosis not present

## 2017-09-30 DIAGNOSIS — K5900 Constipation, unspecified: Secondary | ICD-10-CM | POA: Diagnosis not present

## 2017-11-29 IMAGING — CT CT CERVICAL SPINE W/O CM
3 of 6 series · 9 of 33 positions shown, 11 images · non-contrast
Comparison: None.

CLINICAL DATA: Status post fall in bathroom, with concern for head
or cervical spine injury. Hyperglycemia. Initial encounter.

EXAM:
CT HEAD WITHOUT CONTRAST
CT CERVICAL SPINE WITHOUT CONTRAST
TECHNIQUE: Multidetector CT imaging of the head and cervical spine was
performed following the standard protocol without intravenous
contrast. Multiplanar CT image reconstructions of the cervical spine
were also generated.

[Series 5: c-spine st · axial · 0.28mm/px · z∈[-368,-368]mm · 1 of 81 slices shown, 2 images]
[im 41/81  soft-tissue]
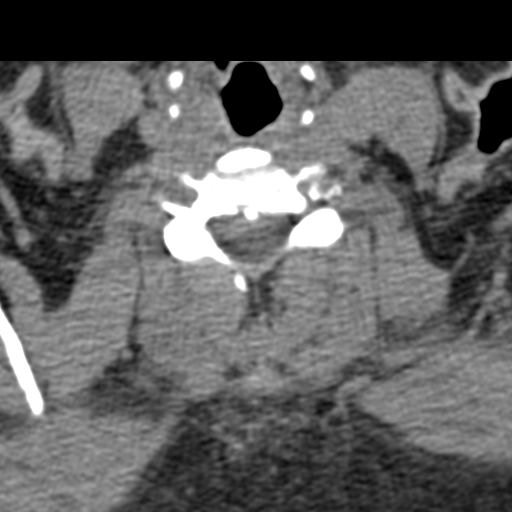
[im 41/81  bone]
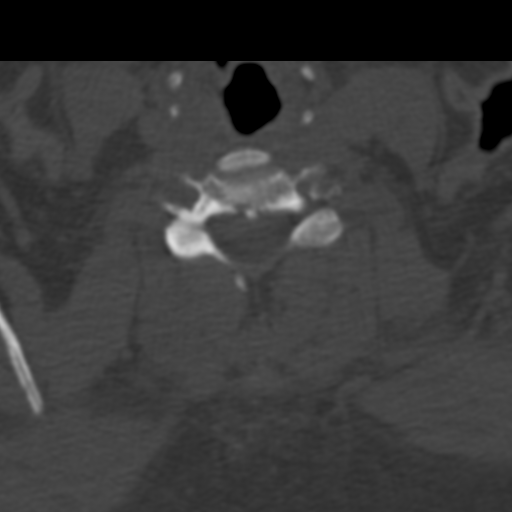

[Series 7: sagittal · sagittal · 0.16mm/px · 5 of 53 slices shown, 6 images]
[im 18/53  bone]
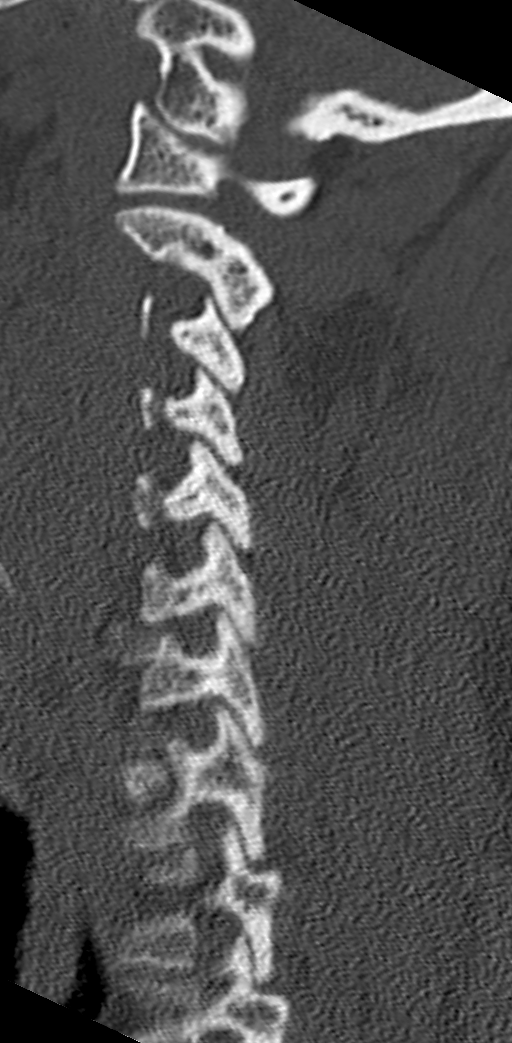
[im 22/53  bone]
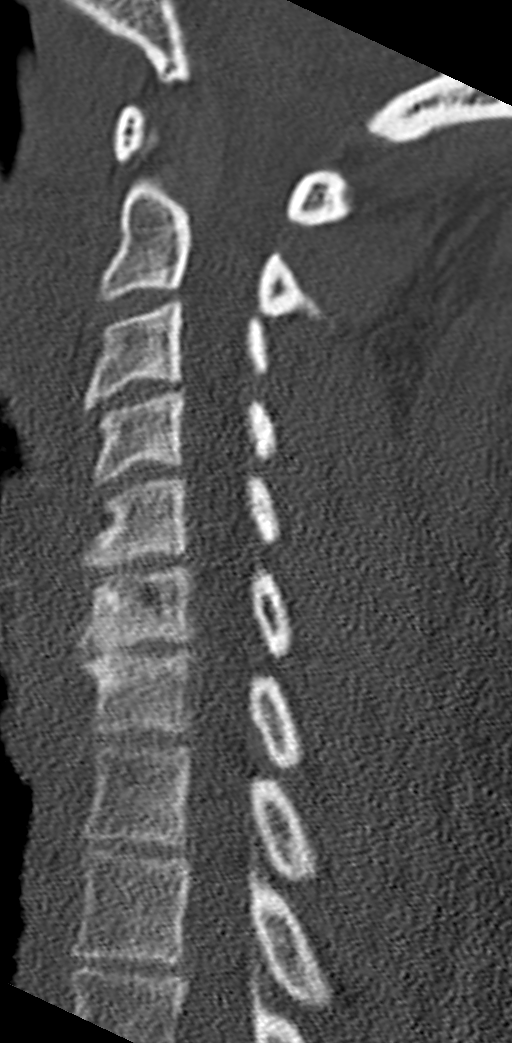
[im 27/53  soft-tissue]
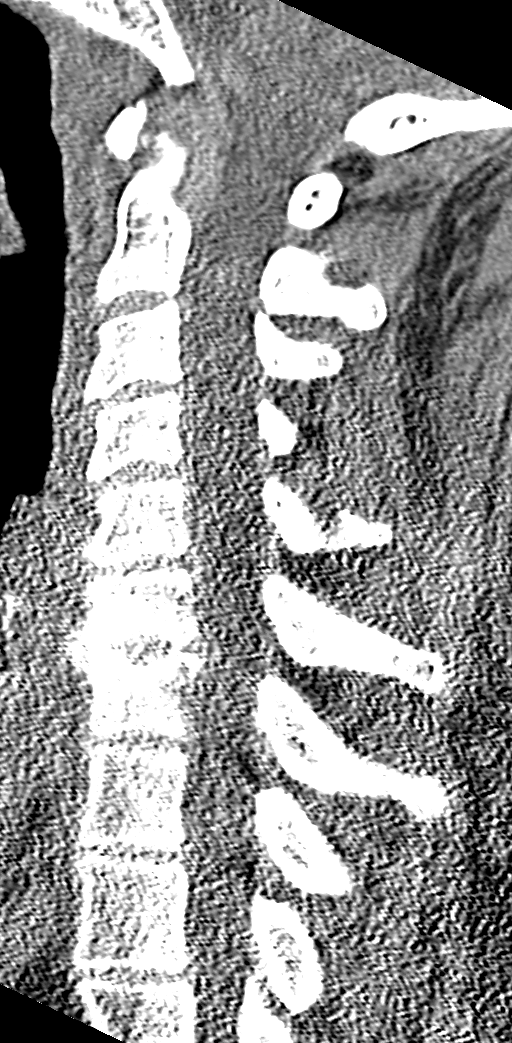
[im 27/53  bone]
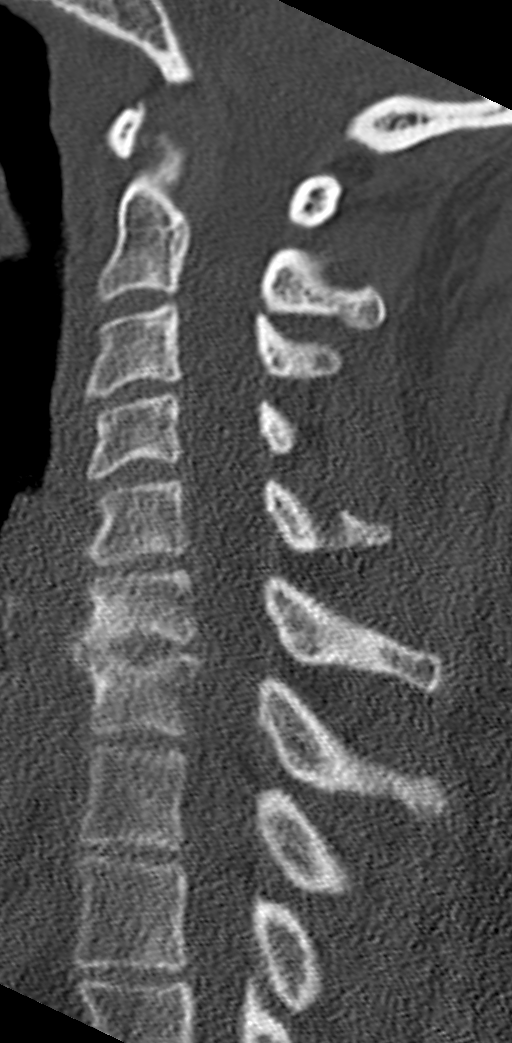
[im 31/53  bone]
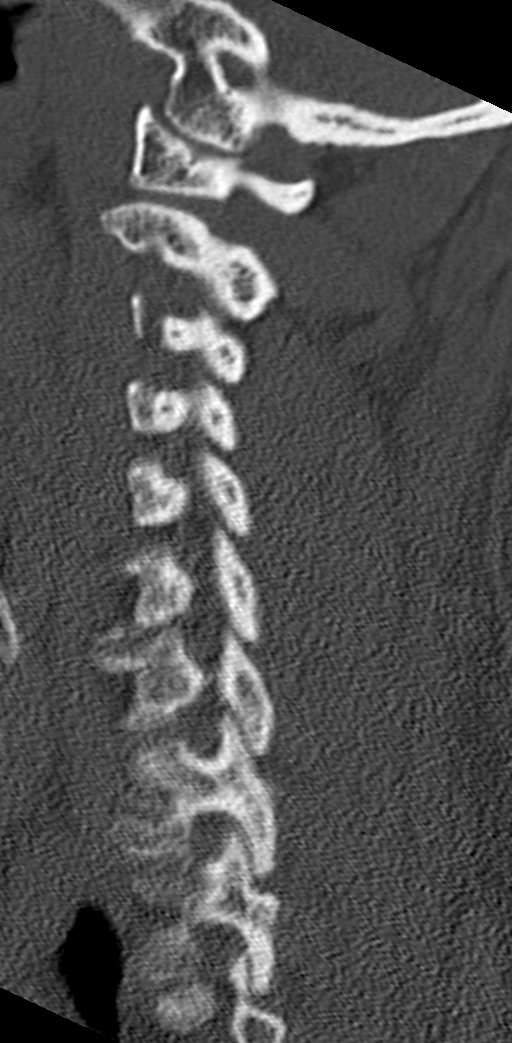
[im 35/53  bone]
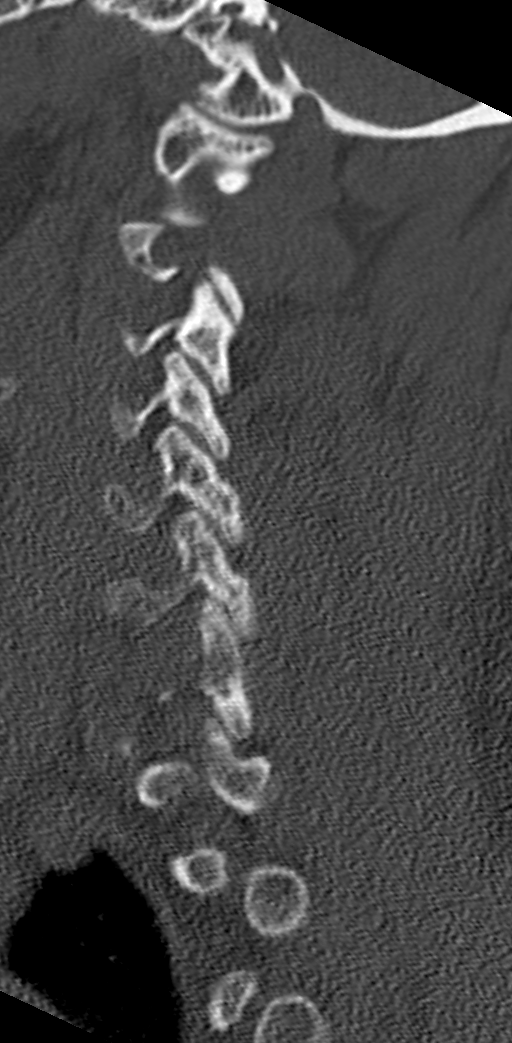

[Series 8: coronal · coronal · 0.20mm/px · 3 of 40 slices shown]
[im 8/40  bone]
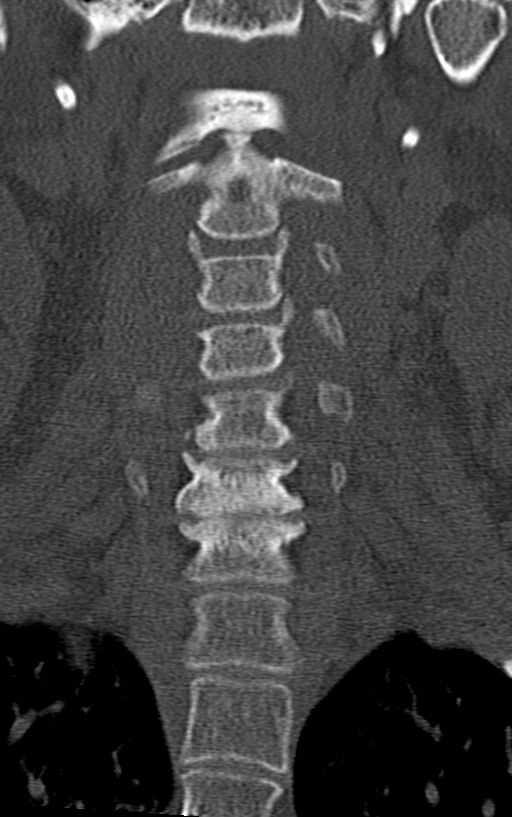
[im 16/40  bone]
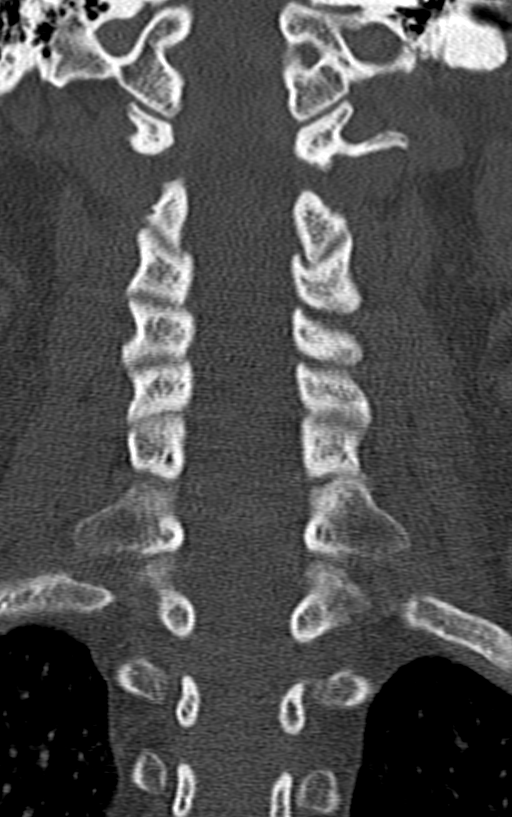
[im 24/40  bone]
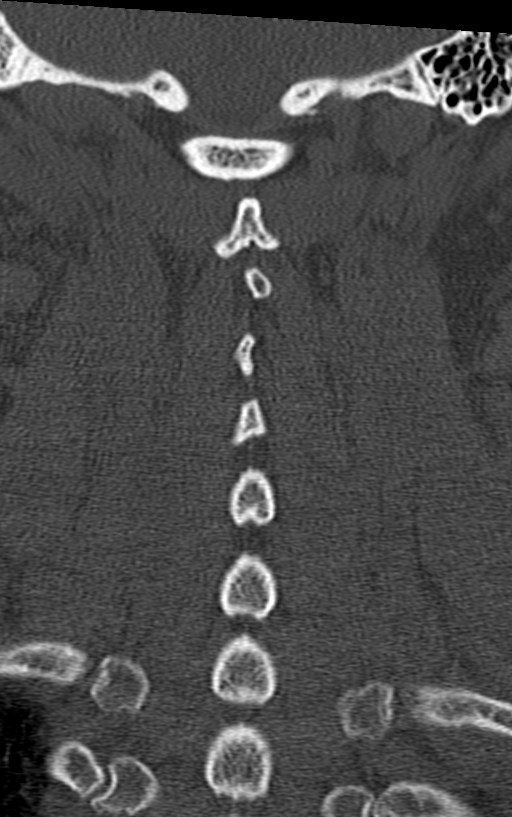

[9 of 33 positions shown; findings below may reference images not displayed]

FINDINGS: CT HEAD FINDINGS

There is no evidence of acute infarction, mass lesion, or intra- or
extra-axial hemorrhage on CT.

Prominence of the ventricles and sulci reflects mild cortical volume
loss. Scattered periventricular and subcortical white matter change
likely reflects small vessel ischemic microangiopathy. Mild
cerebellar atrophy is noted.

The brainstem and fourth ventricle are within normal limits. The
basal ganglia are unremarkable in appearance. The cerebral
hemispheres demonstrate grossly normal gray-white differentiation.
No mass effect or midline shift is seen.

There is no evidence of fracture; visualized osseous structures are
unremarkable in appearance. The visualized portions of the orbits
are within normal limits. The paranasal sinuses and mastoid air
cells are well-aerated. No significant soft tissue abnormalities are
seen.

CT CERVICAL SPINE FINDINGS

There is no evidence of fracture or subluxation. Vertebral bodies
demonstrate normal height and alignment. Mild disc space narrowing
and endplate degenerative change is noted at C6-C7. Prevertebral
soft tissues are within normal limits. The visualized neural
foramina are grossly unremarkable.

The thyroid gland is unremarkable in appearance. The visualized lung
apices are clear. No significant soft tissue abnormalities are seen.
IMPRESSION: 1. No evidence of traumatic intracranial injury or fracture.
2. No evidence of fracture or subluxation along the cervical spine.
3. Mild cortical volume loss and scattered small vessel ischemic
microangiopathy.

## 2019-09-29 ENCOUNTER — Other Ambulatory Visit: Payer: Self-pay | Admitting: General Practice

## 2019-09-29 DIAGNOSIS — R413 Other amnesia: Secondary | ICD-10-CM

## 2019-10-05 ENCOUNTER — Other Ambulatory Visit (HOSPITAL_COMMUNITY): Payer: Self-pay | Admitting: General Practice

## 2019-10-05 ENCOUNTER — Telehealth: Payer: Self-pay

## 2019-10-05 DIAGNOSIS — R9431 Abnormal electrocardiogram [ECG] [EKG]: Secondary | ICD-10-CM

## 2019-10-05 NOTE — Telephone Encounter (Signed)
NOTES ON FILE FROM [facility] [phone], SENT REFERRAL TO SCHEDULING 

## 2019-10-12 ENCOUNTER — Encounter (HOSPITAL_COMMUNITY): Payer: Self-pay | Admitting: Emergency Medicine

## 2019-10-12 ENCOUNTER — Emergency Department (HOSPITAL_COMMUNITY): Payer: Medicare Other

## 2019-10-12 ENCOUNTER — Emergency Department (HOSPITAL_COMMUNITY)
Admission: EM | Admit: 2019-10-12 | Discharge: 2019-10-12 | Disposition: A | Discharge status: Home / Self Care | Payer: Medicare Other | Attending: Emergency Medicine | Admitting: Emergency Medicine

## 2019-10-12 ENCOUNTER — Other Ambulatory Visit: Payer: Self-pay

## 2019-10-12 DIAGNOSIS — S8001XA Contusion of right knee, initial encounter: Secondary | ICD-10-CM | POA: Diagnosis not present

## 2019-10-12 DIAGNOSIS — W19XXXA Unspecified fall, initial encounter: Secondary | ICD-10-CM | POA: Diagnosis not present

## 2019-10-12 DIAGNOSIS — I10 Essential (primary) hypertension: Secondary | ICD-10-CM | POA: Diagnosis not present

## 2019-10-12 DIAGNOSIS — Z7982 Long term (current) use of aspirin: Secondary | ICD-10-CM | POA: Diagnosis not present

## 2019-10-12 DIAGNOSIS — Y999 Unspecified external cause status: Secondary | ICD-10-CM | POA: Diagnosis not present

## 2019-10-12 DIAGNOSIS — B3731 Acute candidiasis of vulva and vagina: Secondary | ICD-10-CM

## 2019-10-12 DIAGNOSIS — Y9301 Activity, walking, marching and hiking: Secondary | ICD-10-CM | POA: Insufficient documentation

## 2019-10-12 DIAGNOSIS — M1711 Unilateral primary osteoarthritis, right knee: Secondary | ICD-10-CM | POA: Diagnosis not present

## 2019-10-12 DIAGNOSIS — S8991XA Unspecified injury of right lower leg, initial encounter: Secondary | ICD-10-CM | POA: Diagnosis present

## 2019-10-12 DIAGNOSIS — E119 Type 2 diabetes mellitus without complications: Secondary | ICD-10-CM | POA: Diagnosis not present

## 2019-10-12 DIAGNOSIS — Z79899 Other long term (current) drug therapy: Secondary | ICD-10-CM | POA: Diagnosis not present

## 2019-10-12 DIAGNOSIS — E1165 Type 2 diabetes mellitus with hyperglycemia: Secondary | ICD-10-CM

## 2019-10-12 DIAGNOSIS — B373 Candidiasis of vulva and vagina: Secondary | ICD-10-CM | POA: Diagnosis not present

## 2019-10-12 DIAGNOSIS — Z794 Long term (current) use of insulin: Secondary | ICD-10-CM | POA: Diagnosis not present

## 2019-10-12 DIAGNOSIS — Y929 Unspecified place or not applicable: Secondary | ICD-10-CM | POA: Diagnosis not present

## 2019-10-12 LAB — COMPREHENSIVE METABOLIC PANEL
ALT: 17 U/L (ref 0–44)
AST: 13 U/L — ABNORMAL LOW (ref 15–41)
Albumin: 3.8 g/dL (ref 3.5–5.0)
Alkaline Phosphatase: 80 U/L (ref 38–126)
Anion gap: 9 (ref 5–15)
BUN: 24 mg/dL — ABNORMAL HIGH (ref 8–23)
CO2: 29 mmol/L (ref 22–32)
Calcium: 9.2 mg/dL (ref 8.9–10.3)
Chloride: 98 mmol/L (ref 98–111)
Creatinine, Ser: 0.92 mg/dL (ref 0.44–1.00)
GFR calc Af Amer: >60 mL/min (ref >60)
GFR calc non Af Amer: >60 mL/min (ref >60)
Glucose, Bld: 370 mg/dL — ABNORMAL HIGH (ref 70–99)
Potassium: 4.6 mmol/L (ref 3.5–5.1)
Sodium: 136 mmol/L (ref 135–145)
Total Bilirubin: 0.5 mg/dL (ref 0.3–1.2)
Total Protein: 7 g/dL (ref 6.5–8.1)

## 2019-10-12 LAB — URINALYSIS, ROUTINE W REFLEX MICROSCOPIC
Bacteria, UA: NONE SEEN
Bilirubin Urine: NEGATIVE
Glucose, UA: >=500 mg/dL — AB
Hgb urine dipstick: NEGATIVE
Ketones, ur: NEGATIVE mg/dL
Leukocytes,Ua: NEGATIVE
Nitrite: NEGATIVE
Protein, ur: NEGATIVE mg/dL
Specific Gravity, Urine: 1.022 (ref 1.005–1.030)
pH: 5 (ref 5.0–8.0)

## 2019-10-12 LAB — CBC WITH DIFFERENTIAL/PLATELET
Abs Immature Granulocytes: 0.04 10*3/uL (ref 0.00–0.07)
Basophils Absolute: 0 10*3/uL (ref 0.0–0.1)
Basophils Relative: 0 %
Eosinophils Absolute: 0.3 10*3/uL (ref 0.0–0.5)
Eosinophils Relative: 3 %
HCT: 43.3 % (ref 36.0–46.0)
Hemoglobin: 14 g/dL (ref 12.0–15.0)
Immature Granulocytes: 1 %
Lymphocytes Relative: 40 %
Lymphs Abs: 3.4 10*3/uL (ref 0.7–4.0)
MCH: 26.9 pg (ref 26.0–34.0)
MCHC: 32.3 g/dL (ref 30.0–36.0)
MCV: 83.1 fL (ref 80.0–100.0)
Monocytes Absolute: 0.4 10*3/uL (ref 0.1–1.0)
Monocytes Relative: 5 %
Neutro Abs: 4.3 10*3/uL (ref 1.7–7.7)
Neutrophils Relative %: 51 %
Platelets: 178 10*3/uL (ref 150–400)
RBC: 5.21 MIL/uL — ABNORMAL HIGH (ref 3.87–5.11)
RDW: 13.5 % (ref 11.5–15.5)
WBC: 8.5 10*3/uL (ref 4.0–10.5)
nRBC: 0 % (ref 0.0–0.2)

## 2019-10-12 LAB — CBG MONITORING, ED: Glucose-Capillary: 336 mg/dL — ABNORMAL HIGH (ref 70–99)

## 2019-10-12 MED ORDER — SODIUM CHLORIDE 0.9 % IV BOLUS
500.0000 mL | Freq: Once | INTRAVENOUS | Status: AC
  Administered 2019-10-12: 18:00:00 500 mL via INTRAVENOUS

## 2019-10-12 MED ORDER — FLUCONAZOLE 150 MG PO TABS
150.0000 mg | ORAL_TABLET | Freq: Once | ORAL | Status: AC
  Administered 2019-10-12: 150 mg via ORAL
  Filled 2019-10-12: qty 1

## 2019-10-12 NOTE — Discharge Instructions (Addendum)
Take your normal night medications when you get home.

## 2019-10-12 NOTE — ED Notes (Signed)
Pt ambulated in hall and said her knees hurt while she was walking.  Pt believes if she walks more it will get better.   Pt is slow moving while walking and needs to learn proper use of a walker.  Pt may need standby assist.

## 2019-10-12 NOTE — ED Provider Notes (Signed)
[facility]-EMERGENCY DEPT Provider Note   CSN: [id number] Arrival date & time: 10/12/19  1705     History Chief Complaint  Patient presents with  . Urinary Tract Infection    Rhonda Taylor is a 74 y.o. female.  Pt presents to the ED today with a fall, possible UTI, and elevated blood sugar.  Pt said she fell this am and hurt her right knee.  Pt normally walks with a walker and was trying to get to the bathroom without her walker.  She is unsure what made her fall.  Pt was unable to get up and called EMS to help her.  She did not want to come to the hospital then.  She did see her pcp this afternoon and was told to come to the ED.  She has been ambulatory since the fall.  She is unsure if she took her meds today.          Past Medical History:  Diagnosis Date  . Diabetes mellitus without complication (HCC)   . Hypertension     Patient Active Problem List   Diagnosis Date Noted  . Moderate obesity 12/04/2016  . OSA (obstructive sleep apnea) 06/27/2016    Past Surgical History:  Procedure Laterality Date  . ABDOMINAL HYSTERECTOMY    . CARPAL TUNNEL RELEASE Right      OB History   No obstetric history on file.     History reviewed. No pertinent family history.  Social History   Tobacco Use  . Smoking status: Never Smoker  . Smokeless tobacco: Never Used  Substance Use Topics  . Alcohol use: No  . Drug use: No    Home Medications Prior to Admission medications   Medication Sig Start Date End Date Taking? Authorizing Provider  ALPRAZolam Prudy Feeler) 0.5 MG tablet Take 0.5-1 mg by mouth daily as needed for anxiety.    [provider]  amLODipine (NORVASC) 5 MG tablet Take 5 mg by mouth daily.    [provider]  aspirin EC 81 MG tablet Take 81 mg by mouth daily.    [provider]  atorvastatin (LIPITOR) 20 MG tablet Take 20 mg by mouth daily.    [provider]  Cholecalciferol (VITAMIN D3) 5000 units  CAPS Take by mouth.    [provider]  clonazePAM (KLONOPIN) 0.5 MG tablet Take 0.5 mg by mouth 2 (two) times daily as needed for anxiety.    [provider]  diclofenac (VOLTAREN) 75 MG EC tablet Take 75 mg by mouth 2 (two) times daily.    [provider]  furosemide (LASIX) 20 MG tablet Take 20 mg by mouth.    [provider]  gabapentin (NEURONTIN) 300 MG capsule Take 300 mg by mouth 3 (three) times daily.    [provider]  ibuprofen (ADVIL,MOTRIN) 800 MG tablet Take 800 mg by mouth every 8 (eight) hours as needed for moderate pain.    [provider]  insulin lispro (HUMALOG) 100 UNIT/ML injection Inject 25-30 Units into the skin 3 (three) times daily before meals. 25 units at 3pm    [provider]  metFORMIN (GLUCOPHAGE) 500 MG tablet Take 1 tablet (500 mg total) by mouth 2 (two) times daily with a meal. 05/29/15   Dione Booze, MD  omeprazole (PRILOSEC) 40 MG capsule Take 40 mg by mouth. 03/08/16   [provider]  polyethylene glycol (MIRALAX / GLYCOLAX) packet Take 17 g by mouth daily. 02/04/16   Dayton Scrape,  Renie Ora, MD  traMADol (ULTRAM) 50 MG tablet Take 50 mg by mouth every 6 (six) hours as needed for moderate pain.    [provider]  valsartan (DIOVAN) 320 MG tablet Take 320 mg by mouth.    [provider]    Allergies    Patient has no known allergies.  Review of Systems   Review of Systems  Musculoskeletal:       Left knee pain  All other systems reviewed and are negative.   Physical Exam Updated Vital Signs BP (!) 162/74   Pulse 64   Resp 17   Ht 5\' 2"  (1.575 m)   Wt 100.7 kg   SpO2 100%   BMI 40.60 kg/m   Physical Exam Vitals and nursing note reviewed.  Constitutional:      Appearance: Normal appearance. She is obese.  HENT:     Head: Normocephalic and atraumatic.     Right Ear: External ear normal.     Left Ear: External ear normal.     Nose: Nose normal.      Mouth/Throat:     Mouth: Mucous membranes are moist.     Pharynx: Oropharynx is clear.  Eyes:     Extraocular Movements: Extraocular movements intact.     Conjunctiva/sclera: Conjunctivae normal.     Pupils: Pupils are equal, round, and reactive to light.  Cardiovascular:     Rate and Rhythm: Normal rate and regular rhythm.     Pulses: Normal pulses.     Heart sounds: Normal heart sounds.  Pulmonary:     Effort: Pulmonary effort is normal.     Breath sounds: Normal breath sounds.  Abdominal:     General: Abdomen is flat. Bowel sounds are normal.     Palpations: Abdomen is soft.  Musculoskeletal:     Cervical back: Normal range of motion and neck supple.       Legs:  Skin:    General: Skin is warm.     Capillary Refill: Capillary refill takes less than 2 seconds.  Neurological:     General: No focal deficit present.     Mental Status: She is alert and oriented to person, place, and time.  Psychiatric:        Mood and Affect: Mood normal.        Behavior: Behavior normal.        Thought Content: Thought content normal.        Judgment: Judgment normal.     ED Results / Procedures / Treatments   Labs (all labs ordered are listed, but only abnormal results are displayed) Labs Reviewed  COMPREHENSIVE METABOLIC PANEL - Abnormal; Notable for the following components:      Result Value   Glucose, Bld 370 (*)    BUN 24 (*)    AST 13 (*)    All other components within normal limits  CBC WITH DIFFERENTIAL/PLATELET - Abnormal; Notable for the following components:   RBC 5.21 (*)    All other components within normal limits  URINALYSIS, ROUTINE W REFLEX MICROSCOPIC - Abnormal; Notable for the following components:   Glucose, UA >=500 (*)    All other components within normal limits  CBG MONITORING, ED - Abnormal; Notable for the following components:   Glucose-Capillary 336 (*)    All other components within normal limits  URINE CULTURE    EKG EKG  Interpretation  Date/Time:  Tuesday October 12 2019 17:29:16 EDT Ventricular Rate:  68 PR Interval:  QRS Duration: 93 QT Interval:  427 QTC Calculation: 455 R Axis:   -11 Text Interpretation: Sinus rhythm Nonspecific T abnormalities, lateral leads No significant change since last tracing Confirmed by Isla Pence [id number]) on 10/12/2019 5:36:39 PM   Radiology DG Chest 2 View  Result Date: 10/12/2019 CLINICAL DATA:  Post fall.  Multiple falls this week. EXAM: CHEST - 2 VIEW COMPARISON:  09/20/2016 FINDINGS: The cardiomediastinal contours are normal. Upper normal heart size unchanged. The lungs are clear. Right paratracheal density corresponds to tortuous vessel is on cervical spine CT 05/29/2015. Pulmonary vasculature is normal. No consolidation, pleural effusion, or pneumothorax. No acute osseous abnormalities are seen. IMPRESSION: No acute chest findings. Electronically Signed   By: Keith Rake M.D.   On: 10/12/2019 18:58   DG Knee Complete 4 Views Right  Result Date: 10/12/2019 CLINICAL DATA:  Right knee pain after fall. Multiple falls this week. EXAM: RIGHT KNEE - COMPLETE 4+ VIEW COMPARISON:  None. FINDINGS: No evidence of fracture or dislocation. Moderate tricompartmental osteoarthritis with peripheral spurring. Mild tricompartmental joint space narrowing. No erosion or evidence of focal bone lesion. Small knee joint effusion. Soft tissues are unremarkable. IMPRESSION: Moderate tricompartmental osteoarthritis without acute fracture or subluxation of the right knee. Electronically Signed   By: Keith Rake M.D.   On: 10/12/2019 18:59    Procedures Procedures (including critical care time)  Medications Ordered in ED Medications  fluconazole (DIFLUCAN) tablet 150 mg (has no administration in time range)  sodium chloride 0.9 % bolus 500 mL (500 mLs Intravenous New Bag/Given 10/12/19 1825)    ED Course  I have reviewed the triage vital signs and the nursing notes.  Pertinent  labs & imaging results that were available during my care of the patient were reviewed by me and considered in my medical decision making (see chart for details).    MDM Rules/Calculators/A&P                          Pt's nurse noticed some thick, white vaginal d/c when she went to do the urine cath.  Likely yeast from DM.  Pt likely did not take her am meds today as she is hypertensive and her blood sugar is elevated.  Pt told to take her normal pm meds when she gets home.  Pt is able to ambulate with a walker.  Return if worse.  F/u with pcp.  Final Clinical Impression(s) / ED Diagnoses Final diagnoses:  Fall, initial encounter  Arthritis of knee, right  Contusion of right knee, initial encounter  Poorly controlled type 2 diabetes mellitus ([place])  Vaginal candidiasis    Rx / DC Orders ED Discharge Orders    None       Isla Pence, MD 10/12/19 1920

## 2019-10-12 NOTE — ED Triage Notes (Signed)
[facility] transferred pt from [facility] to [facility] ED and reports the following:  Pt fell around 10 AM this morning, called EMS, EMS said her blood glucose was in the 100s, and she declined transport. Pt fell again this afternoon, called her doctor's office, went doctor, and was referred by doctor to ED for incontinence and immobility. She has bilateral pain below the knees caps. Blood glucose at 440 at 16:56.

## 2019-10-14 LAB — URINE CULTURE: Culture: NO GROWTH

## 2019-10-20 ENCOUNTER — Ambulatory Visit
Admission: RE | Admit: 2019-10-20 | Discharge: 2019-10-20 | Disposition: A | Discharge status: Home / Self Care | Payer: Medicare Other | Source: Ambulatory Visit | Attending: General Practice | Admitting: General Practice

## 2019-10-20 DIAGNOSIS — R413 Other amnesia: Secondary | ICD-10-CM

## 2019-10-29 ENCOUNTER — Telehealth (HOSPITAL_COMMUNITY): Payer: Self-pay | Admitting: General Practice

## 2019-10-29 ENCOUNTER — Other Ambulatory Visit (HOSPITAL_COMMUNITY): Payer: Medicare Other

## 2019-10-29 ENCOUNTER — Encounter (HOSPITAL_COMMUNITY): Payer: Self-pay

## 2019-10-29 NOTE — Progress Notes (Signed)
Verified appointment "no show" status with Henriette Combs at 10:28.

## 2019-10-29 NOTE — Telephone Encounter (Signed)
I called patient to reschedule echocardiogram that she NO SHOWED on 10/29/19 that was ordered by Dr. Rondel Oh.  Patient did not reschedule due to she was moving to [place] and would not be here in the state anymore. I called and informed [place] at [facility] health of this information and order will be removed from the [facility].

## 2023-10-21 DEATH — deceased
